# Patient Record
Sex: Female | Born: 1940 | Race: White | Hispanic: No | Smoking: Never smoker
Health system: Southern US, Community
[De-identification: ages and names within clinical notes are randomized; demographics above are authoritative.]

## PROBLEM LIST (undated history)

## (undated) ENCOUNTER — Emergency Department (HOSPITAL_COMMUNITY): Discharge status: Absent without leave | Payer: Medicare Other

## (undated) DIAGNOSIS — M1990 Unspecified osteoarthritis, unspecified site: Secondary | ICD-10-CM

## (undated) DIAGNOSIS — K5792 Diverticulitis of intestine, part unspecified, without perforation or abscess without bleeding: Secondary | ICD-10-CM

## (undated) DIAGNOSIS — I10 Essential (primary) hypertension: Secondary | ICD-10-CM

---

## 2004-02-08 ENCOUNTER — Encounter: Admission: RE | Admit: 2004-02-08 | Discharge: 2004-02-08 | Payer: Self-pay | Admitting: Orthopedic Surgery

## 2011-07-11 ENCOUNTER — Encounter: Payer: Self-pay | Admitting: *Deleted

## 2011-07-11 ENCOUNTER — Emergency Department (HOSPITAL_BASED_OUTPATIENT_CLINIC_OR_DEPARTMENT_OTHER)
Admission: EM | Admit: 2011-07-11 | Discharge: 2011-07-11 | Disposition: A | Discharge status: Home / Self Care | Payer: Medicare Other | Attending: Emergency Medicine | Admitting: Emergency Medicine

## 2011-07-11 DIAGNOSIS — I10 Essential (primary) hypertension: Secondary | ICD-10-CM | POA: Insufficient documentation

## 2011-07-11 DIAGNOSIS — L259 Unspecified contact dermatitis, unspecified cause: Secondary | ICD-10-CM | POA: Insufficient documentation

## 2011-07-11 DIAGNOSIS — Z79899 Other long term (current) drug therapy: Secondary | ICD-10-CM | POA: Insufficient documentation

## 2011-07-11 HISTORY — DX: Essential (primary) hypertension: I10

## 2011-07-11 MED ORDER — DIPHENHYDRAMINE HCL 2 % EX CREA: TOPICAL_CREAM | Freq: Three times a day (TID) | CUTANEOUS | Status: AC | PRN

## 2011-07-11 MED ORDER — PREDNISONE 20 MG PO TABS
60.0000 mg | ORAL_TABLET | Freq: Once | ORAL | Status: AC
  Administered 2011-07-11: 60 mg via ORAL
  Filled 2011-07-11: qty 3

## 2011-07-11 MED ORDER — PREDNISONE 20 MG PO TABS: ORAL_TABLET | ORAL | Status: DC

## 2011-07-11 NOTE — ED Provider Notes (Signed)
Scribed for Dayton Bailiff, MD, the patient was seen in room MH09/MH09 . This chart was scribed by Ellie Lunch. This patient's care was started at 10:51 PM.   CSN: [id number] Arrival date & time: 07/11/2011 10:38 PM   Chief Complaint  Patient presents with  . Rash   (Include location/radiation/quality/duration/timing/severity/associated sxs/prior treatment) HPI Angela Griffin is a 70 y.o. female who presents to the Emergency Department complaining of rash starting yesterday. Pt reports she was in her garden, cut a plant in her garden that smelled like eucalyptus, and developed a rash. Pt is concerned that she will be unable to board a plane in a few days.  She denies shortness of breath, oral lesions.  Has been applying hydrocortisone cream to the area for one day without relief.  States that the area has spread from her arm to her neck.  Denies cp, fever, chills, abd pain, n/v  Past Medical History  Diagnosis Date  . Hypertension     History reviewed. No pertinent past surgical history.  History reviewed. No pertinent family history.  History  Substance Use Topics  . Smoking status: Not on file  . Smokeless tobacco: Not on file  . Alcohol Use:     Review of Systems 10 Systems reviewed and are negative for acute change except as noted in the HPI.   Allergies  Review of patient's allergies indicates no known allergies.  Home Medications   Current Outpatient Rx  Name Route Sig Dispense Refill  . CALCIUM CARBONATE-VITAMIN D 600-400 MG-UNIT PO TABS Oral Take 1 tablet by mouth daily.      Marland Kitchen CLONAZEPAM 0.5 MG PO TABS Oral Take 0.5 mg by mouth at bedtime.      Marland Kitchen ONE-DAILY MULTI VITAMINS PO TABS Oral Take 1 tablet by mouth daily.      Marland Kitchen PRESCRIPTION MEDICATION Oral Take 0.5 tablets by mouth daily. Unknown blood pressure med     . TRAZODONE HCL 50 MG PO TABS Oral Take 50 mg by mouth at bedtime.      Marland Kitchen DIPHENHYDRAMINE HCL 2 % EX CREA Topical Apply topically 3 (three) times daily as  needed for itching. 30 g 0  . PREDNISONE 20 MG PO TABS  Please take 3 tablets by mouth for 3 days, 2 tablets by mouth for 3 days, 1 tablet by mouth for 3 days 18 tablet 0    Physical Exam    BP 140/100  Pulse 50  Temp(Src) 98.4 F (36.9 C) (Oral)  Resp 20  Ht 5\' 2"  (1.575 m)  Wt 120 lb (54.432 kg)  BMI 21.95 kg/m2  SpO2 96%  Physical Exam  Nursing note and vitals reviewed. Constitutional: She is oriented to person, place, and time. She appears well-developed and well-nourished.  HENT:  Head: Normocephalic and atraumatic.  Eyes: Conjunctivae and EOM are normal. Pupils are equal, round, and reactive to light.  Neck: Normal range of motion. Neck supple.  Cardiovascular: Normal rate, regular rhythm and normal heart sounds.   Pulmonary/Chest: Effort normal and breath sounds normal.  Abdominal: Soft. Bowel sounds are normal.  Musculoskeletal: Normal range of motion.  Neurological: She is alert and oriented to person, place, and time.  Skin: Skin is warm and dry. Rash (contact dermatitis to left antecubital fossa, back of neck right side. also with small area near the mouth on right.) noted.  Psychiatric: She has a normal mood and affect.    ED Course  Procedures  OTHER DATA REVIEWED: Nursing notes, vital signs, and  past medical records reviewed.   DIAGNOSTIC STUDIES: Oxygen Saturation is 96% on room air, normal by my interpretation.     ED COURSE / COORDINATION OF CARE: 22:52 Discussed rash with Pt. Likely poison ivy rash. Plan to treat with steroids. For itching, recommended PT use benadryl cream or OTC non-steroid anti-itch cream.  MDM: The patient's rash is consistent with a mild case of contact dermatitis. She is concerned that she will be unable to board the plane for her vacation in a few days due to the rash as one of her relatives had a similar instance.  Given her situation and early facial involvement, I will place her on a steroid taper and recommend her to apply  benadryl cream for symptom relief.  i explained that a steroid injection may result in return of the rash and that a tapering dose is the standard treatment.  She has no resp sx.  She will be dc home with instructions to follow up with her primary physician as needed.  Signs and sx for which to return to ed were provided.  SCRIBE ATTESTATION: I personally performed the services described in this documentation, which was scribed in my presence. The recorded information has been reviewed and considered.         Dayton Bailiff, MD 07/11/11 2328

## 2011-07-11 NOTE — ED Notes (Signed)
Pt states she was cutting some bushes and then developed a rash to her face, neck and arms. +itching. Has planned a trip and is afraid they won't allow her on the plane with the rash.

## 2012-02-11 ENCOUNTER — Ambulatory Visit: Payer: Medicare Other | Admitting: Rehabilitation

## 2012-02-16 ENCOUNTER — Ambulatory Visit: Payer: Medicare Other | Admitting: Physical Therapy

## 2012-02-16 DIAGNOSIS — R262 Difficulty in walking, not elsewhere classified: Secondary | ICD-10-CM | POA: Insufficient documentation

## 2012-02-16 DIAGNOSIS — M25673 Stiffness of unspecified ankle, not elsewhere classified: Secondary | ICD-10-CM | POA: Insufficient documentation

## 2012-02-16 DIAGNOSIS — M25579 Pain in unspecified ankle and joints of unspecified foot: Secondary | ICD-10-CM | POA: Insufficient documentation

## 2012-02-16 DIAGNOSIS — M25676 Stiffness of unspecified foot, not elsewhere classified: Secondary | ICD-10-CM | POA: Insufficient documentation

## 2012-02-16 DIAGNOSIS — IMO0001 Reserved for inherently not codable concepts without codable children: Secondary | ICD-10-CM | POA: Insufficient documentation

## 2012-02-18 ENCOUNTER — Ambulatory Visit: Payer: Medicare Other | Admitting: Physical Therapy

## 2012-02-23 ENCOUNTER — Ambulatory Visit: Payer: Medicare Other | Admitting: Physical Therapy

## 2012-02-25 ENCOUNTER — Ambulatory Visit: Payer: Medicare Other | Admitting: Physical Therapy

## 2012-02-25 DIAGNOSIS — M25676 Stiffness of unspecified foot, not elsewhere classified: Secondary | ICD-10-CM | POA: Insufficient documentation

## 2012-02-25 DIAGNOSIS — IMO0001 Reserved for inherently not codable concepts without codable children: Secondary | ICD-10-CM | POA: Insufficient documentation

## 2012-02-25 DIAGNOSIS — M25579 Pain in unspecified ankle and joints of unspecified foot: Secondary | ICD-10-CM | POA: Insufficient documentation

## 2012-02-25 DIAGNOSIS — R262 Difficulty in walking, not elsewhere classified: Secondary | ICD-10-CM | POA: Insufficient documentation

## 2012-02-25 DIAGNOSIS — M25673 Stiffness of unspecified ankle, not elsewhere classified: Secondary | ICD-10-CM | POA: Insufficient documentation

## 2012-03-01 ENCOUNTER — Ambulatory Visit: Payer: Medicare Other | Admitting: Physical Therapy

## 2012-03-03 ENCOUNTER — Ambulatory Visit: Payer: Medicare Other | Admitting: Physical Therapy

## 2012-03-07 ENCOUNTER — Ambulatory Visit: Payer: Medicare Other | Admitting: Physical Therapy

## 2012-03-08 ENCOUNTER — Ambulatory Visit: Payer: Medicare Other | Admitting: Physical Therapy

## 2012-12-01 ENCOUNTER — Emergency Department (HOSPITAL_BASED_OUTPATIENT_CLINIC_OR_DEPARTMENT_OTHER)
Admission: EM | Admit: 2012-12-01 | Discharge: 2012-12-02 | Disposition: A | Discharge status: Home / Self Care | Payer: Medicare Other | Attending: Emergency Medicine | Admitting: Emergency Medicine

## 2012-12-01 ENCOUNTER — Encounter (HOSPITAL_BASED_OUTPATIENT_CLINIC_OR_DEPARTMENT_OTHER): Payer: Self-pay | Admitting: Emergency Medicine

## 2012-12-01 ENCOUNTER — Emergency Department (HOSPITAL_BASED_OUTPATIENT_CLINIC_OR_DEPARTMENT_OTHER): Payer: Medicare Other

## 2012-12-01 DIAGNOSIS — IMO0002 Reserved for concepts with insufficient information to code with codable children: Secondary | ICD-10-CM | POA: Insufficient documentation

## 2012-12-01 DIAGNOSIS — K5900 Constipation, unspecified: Secondary | ICD-10-CM | POA: Insufficient documentation

## 2012-12-01 DIAGNOSIS — K5792 Diverticulitis of intestine, part unspecified, without perforation or abscess without bleeding: Secondary | ICD-10-CM

## 2012-12-01 DIAGNOSIS — Z8739 Personal history of other diseases of the musculoskeletal system and connective tissue: Secondary | ICD-10-CM | POA: Insufficient documentation

## 2012-12-01 DIAGNOSIS — R1084 Generalized abdominal pain: Secondary | ICD-10-CM | POA: Insufficient documentation

## 2012-12-01 DIAGNOSIS — K261 Acute duodenal ulcer with perforation: Secondary | ICD-10-CM | POA: Insufficient documentation

## 2012-12-01 DIAGNOSIS — I10 Essential (primary) hypertension: Secondary | ICD-10-CM | POA: Insufficient documentation

## 2012-12-01 DIAGNOSIS — Z79899 Other long term (current) drug therapy: Secondary | ICD-10-CM | POA: Insufficient documentation

## 2012-12-01 DIAGNOSIS — R110 Nausea: Secondary | ICD-10-CM | POA: Insufficient documentation

## 2012-12-01 HISTORY — DX: Unspecified osteoarthritis, unspecified site: M19.90

## 2012-12-01 HISTORY — DX: Diverticulitis of intestine, part unspecified, without perforation or abscess without bleeding: K57.92

## 2012-12-01 LAB — URINALYSIS, ROUTINE W REFLEX MICROSCOPIC
Bilirubin Urine: NEGATIVE
Ketones, ur: NEGATIVE mg/dL
Leukocytes, UA: NEGATIVE
Nitrite: NEGATIVE
Specific Gravity, Urine: 1.01 (ref 1.005–1.030)
pH: 7.5 (ref 5.0–8.0)

## 2012-12-01 LAB — CBC WITH DIFFERENTIAL/PLATELET
Eosinophils Absolute: 0.1 10*3/uL (ref 0.0–0.7)
Eosinophils Relative: 1 % (ref 0–5)
Hemoglobin: 12.4 g/dL (ref 12.0–15.0)
Lymphocytes Relative: 15 % (ref 12–46)
Lymphs Abs: 1.6 10*3/uL (ref 0.7–4.0)
Monocytes Absolute: 1.2 10*3/uL — ABNORMAL HIGH (ref 0.1–1.0)
Monocytes Relative: 12 % (ref 3–12)
RBC: 3.85 MIL/uL — ABNORMAL LOW (ref 3.87–5.11)
WBC: 10.5 10*3/uL (ref 4.0–10.5)

## 2012-12-01 LAB — LACTIC ACID, PLASMA: Lactic Acid, Venous: 0.6 mmol/L (ref 0.5–2.2)

## 2012-12-01 LAB — COMPREHENSIVE METABOLIC PANEL
AST: 22 U/L (ref 0–37)
Albumin: 4 g/dL (ref 3.5–5.2)
BUN: 11 mg/dL (ref 6–23)
CO2: 26 mEq/L (ref 19–32)
Calcium: 10.3 mg/dL (ref 8.4–10.5)
Creatinine, Ser: 0.9 mg/dL (ref 0.50–1.10)
GFR calc Af Amer: 72 mL/min — ABNORMAL LOW (ref >90)
GFR calc non Af Amer: 62 mL/min — ABNORMAL LOW (ref >90)
Glucose, Bld: 96 mg/dL (ref 70–99)
Total Bilirubin: 0.4 mg/dL (ref 0.3–1.2)

## 2012-12-01 LAB — URINE MICROSCOPIC-ADD ON

## 2012-12-01 LAB — OCCULT BLOOD X 1 CARD TO LAB, STOOL: Fecal Occult Bld: NEGATIVE

## 2012-12-01 LAB — LIPASE, BLOOD: Lipase: 90 U/L — ABNORMAL HIGH (ref 11–59)

## 2012-12-01 MED ORDER — SODIUM CHLORIDE 0.9 % IV SOLN
Freq: Once | INTRAVENOUS | Status: AC
  Administered 2012-12-01: 100 mL/h via INTRAVENOUS

## 2012-12-01 MED ORDER — IOHEXOL 300 MG/ML  SOLN
50.0000 mL | Freq: Once | INTRAMUSCULAR | Status: AC | PRN
  Administered 2012-12-01: 50 mL via ORAL

## 2012-12-01 MED ORDER — IOHEXOL 300 MG/ML  SOLN
100.0000 mL | Freq: Once | INTRAMUSCULAR | Status: AC | PRN
  Administered 2012-12-01: 100 mL via INTRAVENOUS

## 2012-12-01 NOTE — ED Notes (Signed)
Bilateral low abdominal pain since noon today. Pt reports hx of diverticulitis and constipation.  Has taken laxative with no results.

## 2012-12-02 MED ORDER — HYDROCODONE-ACETAMINOPHEN 5-325 MG PO TABS: 1.0000 | ORAL_TABLET | Freq: Four times a day (QID) | ORAL | Status: DC | PRN

## 2012-12-02 MED ORDER — MORPHINE SULFATE 4 MG/ML IJ SOLN
4.0000 mg | Freq: Once | INTRAMUSCULAR | Status: AC
  Administered 2012-12-02: 4 mg via INTRAVENOUS
  Filled 2012-12-02: qty 1

## 2012-12-02 MED ORDER — CIPROFLOXACIN HCL 500 MG PO TABS: 500.0000 mg | ORAL_TABLET | Freq: Two times a day (BID) | ORAL | Status: DC

## 2012-12-02 MED ORDER — ONDANSETRON HCL 4 MG/2ML IJ SOLN
4.0000 mg | Freq: Once | INTRAMUSCULAR | Status: AC
  Administered 2012-12-02: 4 mg via INTRAVENOUS
  Filled 2012-12-02: qty 2

## 2012-12-02 MED ORDER — METRONIDAZOLE IN NACL 5-0.79 MG/ML-% IV SOLN
500.0000 mg | Freq: Once | INTRAVENOUS | Status: AC
  Administered 2012-12-02: 500 mg via INTRAVENOUS
  Filled 2012-12-02: qty 100

## 2012-12-02 MED ORDER — CIPROFLOXACIN IN D5W 400 MG/200ML IV SOLN
400.0000 mg | Freq: Once | INTRAVENOUS | Status: AC
  Administered 2012-12-02: 400 mg via INTRAVENOUS
  Filled 2012-12-02: qty 200

## 2012-12-02 MED ORDER — METRONIDAZOLE 500 MG PO TABS: 500.0000 mg | ORAL_TABLET | Freq: Three times a day (TID) | ORAL | Status: DC

## 2012-12-02 NOTE — ED Provider Notes (Signed)
History     CSN: [id number]  Arrival date & time 12/01/12  2032   First MD Initiated Contact with Patient 12/01/12 2225      Chief Complaint  Patient presents with  . Abdominal Pain    (Consider location/radiation/quality/duration/timing/severity/associated sxs/prior treatment) HPI Comments: Patient presents with lower abdominal pain has been sharp and crampy for the past 12 hours. It is intermittent lasting for hours at a time associated with fullness in her abdomen. She admits that her bowel movements are regular last normal one was 2 days ago. She denies any nausea or vomiting. She took a laxative with no results. She denies any fevers, dysuria, hematuria, vaginal bleeding or discharge. She reports no previous abdominal surgeries. Denies any blood in her stools. Denies any chest pain or shortness of breath. Nothing makes the pain better or worse.  The history is provided by the patient.    Past Medical History  Diagnosis Date  . Hypertension   . Diverticulitis   . Arthritis     History reviewed. No pertinent past surgical history.  No family history on file.  History  Substance Use Topics  . Smoking status: Never Smoker   . Smokeless tobacco: Not on file  . Alcohol Use: Yes     Comment: occasional glass of wine    OB History    Grav Para Term Preterm Abortions TAB SAB Ect Mult Living                  Review of Systems  Constitutional: Negative for fever.  HENT: Negative for congestion and rhinorrhea.   Respiratory: Negative for cough, chest tightness and shortness of breath.   Cardiovascular: Negative for chest pain.  Gastrointestinal: Positive for nausea, abdominal pain and constipation. Negative for vomiting, diarrhea and blood in stool.  Genitourinary: Negative for dysuria, hematuria, vaginal bleeding and vaginal discharge.  Musculoskeletal: Negative for back pain.  Skin: Negative for rash.  Neurological: Negative for dizziness and weakness.  A complete 10  system review of systems was obtained and all systems are negative except as noted in the HPI and PMH.    Allergies  Review of patient's allergies indicates no known allergies.  Home Medications   Current Outpatient Rx  Name  Route  Sig  Dispense  Refill  . LISINOPRIL 20 MG PO TABS   Oral   Take 20 mg by mouth daily.         Marland Kitchen CALCIUM CARBONATE-VITAMIN D 600-400 MG-UNIT PO TABS   Oral   Take 1 tablet by mouth daily.           Marland Kitchen CLONAZEPAM 0.5 MG PO TABS   Oral   Take 0.5 mg by mouth at bedtime.           Marland Kitchen ONE-DAILY MULTI VITAMINS PO TABS   Oral   Take 1 tablet by mouth daily.           Marland Kitchen PREDNISONE 20 MG PO TABS      Please take 3 tablets by mouth for 3 days, 2 tablets by mouth for 3 days, 1 tablet by mouth for 3 days   18 tablet   0   . PRESCRIPTION MEDICATION   Oral   Take 0.5 tablets by mouth daily. Unknown blood pressure med          . TRAZODONE HCL 50 MG PO TABS   Oral   Take 50 mg by mouth at bedtime.  BP 192/89  Pulse 82  Temp 99.1 F (37.3 C) (Oral)  Resp 16  Ht 5\' 2"  (1.575 m)  Wt 122 lb (55.339 kg)  BMI 22.31 kg/m2  SpO2 100%  Physical Exam  Constitutional: She is oriented to person, place, and time. She appears well-developed and well-nourished. No distress.  HENT:  Head: Normocephalic and atraumatic.  Mouth/Throat: Oropharynx is clear and moist. No oropharyngeal exudate.  Eyes: Conjunctivae normal and EOM are normal.  Neck: Normal range of motion. Neck supple.  Cardiovascular: Normal rate, regular rhythm and normal heart sounds.   No murmur heard.      Equal femoral, DP and PT pulses bilaterally  Pulmonary/Chest: Effort normal and breath sounds normal. No respiratory distress.  Abdominal: Soft. There is tenderness. There is guarding. There is no rebound.       Diffuse abdominal tenderness with guarding.  Genitourinary:       FOBT negative. No fecal impaction.  Musculoskeletal: Normal range of motion. She exhibits  no edema and no tenderness.       No CVA tenderness  Neurological: She is alert and oriented to person, place, and time. No cranial nerve deficit. She exhibits normal muscle tone. Coordination normal.       5 Out of 5 strength in the bilateral lower extremities.  Skin: Skin is warm.    ED Course  Procedures (including critical care time)  Labs Reviewed  URINALYSIS, ROUTINE W REFLEX MICROSCOPIC - Abnormal; Notable for the following:    Hgb urine dipstick SMALL (*)     All other components within normal limits  CBC WITH DIFFERENTIAL - Abnormal; Notable for the following:    RBC 3.85 (*)     HCT 35.3 (*)     Monocytes Absolute 1.2 (*)     All other components within normal limits  COMPREHENSIVE METABOLIC PANEL - Abnormal; Notable for the following:    GFR calc non Af Amer 62 (*)     GFR calc Af Amer 72 (*)     All other components within normal limits  LIPASE, BLOOD - Abnormal; Notable for the following:    Lipase 90 (*)     All other components within normal limits  URINE MICROSCOPIC-ADD ON  OCCULT BLOOD X 1 CARD TO LAB, STOOL  LACTIC ACID, PLASMA  PROTIME-INR   Ct Abdomen Pelvis W Contrast  12/02/2012  *RADIOLOGY REPORT*  Clinical Data: Bilateral lower abdominal pain.  History diverticulitis.  Constipation.  CT ABDOMEN AND PELVIS WITH CONTRAST  Technique:  Multidetector CT imaging of the abdomen and pelvis was performed following the standard protocol during bolus administration of intravenous contrast.  Contrast: OMNIPAQUE IOHEXOL 300 MG/ML  SOLN, 50mL OMNIPAQUE IOHEXOL 300 MG/ML  SOLN  Comparison: Report from prior examination performed 04/17/2011 and 10/14/2009.  Films not available.  Findings: Abnormal appearance of the sigmoid colon with thickened wall with scattered diverticula and minimal haziness of surrounding fat planes.  This is suggestive of mild diverticulitis type changes without drainable abscess.  This area of colon will need to be assessed on follow-up after acute  episode has cleared to exclude underlying malignancy.  No surrounding adenopathy.  Hepatic cysts.  Low density structures within the kidneys may be cysts although some too small to adequately characterize.  No calcified gallstones.  Slightly prominent sized pancreatic duct.  No pancreatic mass or calcification noted.  No focal splenic or right adrenal lesion.  Minimal hyperplasia left adrenal gland.  Under distended stomach.  No gross abnormality.  Small pericardial effusion.  Heart slightly enlarged.  Atherosclerotic type changes of the aorta and branch vessels.  No abdominal aortic aneurysm.  Mild ectasia of the common iliac arteries bilaterally.  Degenerative changes most notable L5-S1.  Lung bases clear.  Uterus and ovaries without dominant mass.  IMPRESSION: Findings suggestive of sigmoid diverticulitis.  Follow up as discussed above appear   Original Report Authenticated By: Lacy Duverney, M.D.      No diagnosis found.    MDM  12 hours of crampy abdominal pain, constipation. No vomiting or fever. Abdomen diffusely tender.  UA negative, labs unremarkable though mild elevation of lipase at 90. Lactate normal.  CT scan shows diverticulitis of the sigmoid. Patient started on Cipro and Flagyl. She reports a negative colonoscopy about 7 years ago. NO vomiting in the ED. She has followup with her PCP Dr. Tresa Endo. Dr. Nicanor Alcon to disposition after antibiotics infused.      Glynn Octave, MD 12/02/12 [phone]

## 2014-04-17 DIAGNOSIS — K5790 Diverticulosis of intestine, part unspecified, without perforation or abscess without bleeding: Secondary | ICD-10-CM | POA: Insufficient documentation

## 2014-04-17 DIAGNOSIS — IMO0002 Reserved for concepts with insufficient information to code with codable children: Secondary | ICD-10-CM | POA: Insufficient documentation

## 2014-04-17 DIAGNOSIS — M1990 Unspecified osteoarthritis, unspecified site: Secondary | ICD-10-CM | POA: Insufficient documentation

## 2014-04-17 DIAGNOSIS — I10 Essential (primary) hypertension: Secondary | ICD-10-CM | POA: Insufficient documentation

## 2014-04-17 DIAGNOSIS — K649 Unspecified hemorrhoids: Secondary | ICD-10-CM | POA: Insufficient documentation

## 2014-04-17 DIAGNOSIS — F419 Anxiety disorder, unspecified: Secondary | ICD-10-CM | POA: Insufficient documentation

## 2014-04-17 DIAGNOSIS — M19079 Primary osteoarthritis, unspecified ankle and foot: Secondary | ICD-10-CM | POA: Insufficient documentation

## 2014-04-17 DIAGNOSIS — R7989 Other specified abnormal findings of blood chemistry: Secondary | ICD-10-CM | POA: Insufficient documentation

## 2014-04-17 DIAGNOSIS — G4700 Insomnia, unspecified: Secondary | ICD-10-CM | POA: Insufficient documentation

## 2014-04-17 DIAGNOSIS — D696 Thrombocytopenia, unspecified: Secondary | ICD-10-CM | POA: Insufficient documentation

## 2014-05-05 ENCOUNTER — Encounter (HOSPITAL_BASED_OUTPATIENT_CLINIC_OR_DEPARTMENT_OTHER): Payer: Self-pay | Admitting: Emergency Medicine

## 2014-05-05 ENCOUNTER — Emergency Department (HOSPITAL_BASED_OUTPATIENT_CLINIC_OR_DEPARTMENT_OTHER)
Admission: EM | Admit: 2014-05-05 | Discharge: 2014-05-05 | Disposition: A | Discharge status: Home / Self Care | Payer: Medicare Other | Attending: Emergency Medicine | Admitting: Emergency Medicine

## 2014-05-05 DIAGNOSIS — I10 Essential (primary) hypertension: Secondary | ICD-10-CM

## 2014-05-05 DIAGNOSIS — Z792 Long term (current) use of antibiotics: Secondary | ICD-10-CM | POA: Insufficient documentation

## 2014-05-05 DIAGNOSIS — Z8719 Personal history of other diseases of the digestive system: Secondary | ICD-10-CM | POA: Insufficient documentation

## 2014-05-05 DIAGNOSIS — Z79899 Other long term (current) drug therapy: Secondary | ICD-10-CM | POA: Insufficient documentation

## 2014-05-05 DIAGNOSIS — Z8739 Personal history of other diseases of the musculoskeletal system and connective tissue: Secondary | ICD-10-CM | POA: Insufficient documentation

## 2014-05-05 LAB — URINALYSIS, ROUTINE W REFLEX MICROSCOPIC
BILIRUBIN URINE: NEGATIVE
GLUCOSE, UA: NEGATIVE mg/dL
HGB URINE DIPSTICK: NEGATIVE
KETONES UR: NEGATIVE mg/dL
LEUKOCYTES UA: NEGATIVE
Nitrite: NEGATIVE
Protein, ur: NEGATIVE mg/dL
Specific Gravity, Urine: 1.016 (ref 1.005–1.030)
Urobilinogen, UA: 0.2 mg/dL (ref 0.0–1.0)
pH: 6 (ref 5.0–8.0)

## 2014-05-05 LAB — BASIC METABOLIC PANEL
ANION GAP: 9 (ref 5–15)
BUN: 15 mg/dL (ref 6–23)
CALCIUM: 10.3 mg/dL (ref 8.4–10.5)
CO2: 27 meq/L (ref 19–32)
Chloride: 104 mEq/L (ref 96–112)
Creatinine, Ser: 0.9 mg/dL (ref 0.50–1.10)
GFR, EST AFRICAN AMERICAN: 72 mL/min — AB (ref >90)
GFR, EST NON AFRICAN AMERICAN: 62 mL/min — AB (ref >90)
GLUCOSE: 98 mg/dL (ref 70–99)
Potassium: 4 mEq/L (ref 3.7–5.3)
Sodium: 140 mEq/L (ref 137–147)

## 2014-05-05 LAB — CBC WITH DIFFERENTIAL/PLATELET
Basophils Absolute: 0 10*3/uL (ref 0.0–0.1)
Basophils Relative: 0 % (ref 0–1)
EOS PCT: 1 % (ref 0–5)
Eosinophils Absolute: 0 10*3/uL (ref 0.0–0.7)
HEMATOCRIT: 35.5 % — AB (ref 36.0–46.0)
Hemoglobin: 12.2 g/dL (ref 12.0–15.0)
LYMPHS ABS: 1.5 10*3/uL (ref 0.7–4.0)
Lymphocytes Relative: 25 % (ref 12–46)
MCH: 32.4 pg (ref 26.0–34.0)
MCHC: 34.4 g/dL (ref 30.0–36.0)
MCV: 94.2 fL (ref 78.0–100.0)
MONO ABS: 0.6 10*3/uL (ref 0.1–1.0)
Monocytes Relative: 9 % (ref 3–12)
Neutro Abs: 4 10*3/uL (ref 1.7–7.7)
Neutrophils Relative %: 65 % (ref 43–77)
Platelets: 165 10*3/uL (ref 150–400)
RBC: 3.77 MIL/uL — AB (ref 3.87–5.11)
RDW: 12.2 % (ref 11.5–15.5)
WBC: 6.1 10*3/uL (ref 4.0–10.5)

## 2014-05-05 NOTE — Discharge Instructions (Signed)
Hypertension Hypertension, commonly called high blood pressure, is when the force of blood pumping through your arteries is too strong. Your arteries are the blood vessels that carry blood from your heart throughout your body. A blood pressure reading consists of a higher number over a lower number, such as 110/72. The higher number (systolic) is the pressure inside your arteries when your heart pumps. The lower number (diastolic) is the pressure inside your arteries when your heart relaxes. Ideally you want your blood pressure below 120/80. Hypertension forces your heart to work harder to pump blood. Your arteries may become narrow or stiff. Having hypertension puts you at risk for heart disease, stroke, and other problems.  RISK FACTORS Some risk factors for high blood pressure are controllable. Others are not.  Risk factors you cannot control include:   Race. You may be at higher risk if you are African American.  Age. Risk increases with age.  Gender. Men are at higher risk than women before age 45 years. After age 65, women are at higher risk than men. Risk factors you can control include:  Not getting enough exercise or physical activity.  Being overweight.  Getting too much fat, sugar, calories, or salt in your diet.  Drinking too much alcohol. SIGNS AND SYMPTOMS Hypertension does not usually cause signs or symptoms. Extremely high blood pressure (hypertensive crisis) may cause headache, anxiety, shortness of breath, and nosebleed. DIAGNOSIS  To check if you have hypertension, your health care provider will measure your blood pressure while you are seated, with your arm held at the level of your heart. It should be measured at least twice using the same arm. Certain conditions can cause a difference in blood pressure between your right and left arms. A blood pressure reading that is higher than normal on one occasion does not mean that you need treatment. If one blood pressure reading  is high, ask your health care provider about having it checked again. TREATMENT  Treating high blood pressure includes making lifestyle changes and possibly taking medication. Living a healthy lifestyle can help lower high blood pressure. You may need to change some of your habits. Lifestyle changes may include:  Following the DASH diet. This diet is high in fruits, vegetables, and whole grains. It is low in salt, red meat, and added sugars.  Getting at least 2 1/2 hours of brisk physical activity every week.  Losing weight if necessary.  Not smoking.  Limiting alcoholic beverages.  Learning ways to reduce stress. If lifestyle changes are not enough to get your blood pressure under control, your health care provider may prescribe medicine. You may need to take more than one. Work closely with your health care provider to understand the risks and benefits. HOME CARE INSTRUCTIONS  Have your blood pressure rechecked as directed by your health care provider.   Only take medicine as directed by your health care provider. Follow the directions carefully. Blood pressure medicines must be taken as prescribed. The medicine does not work as well when you skip doses. Skipping doses also puts you at risk for problems.   Do not smoke.   Monitor your blood pressure at home as directed by your health care provider. SEEK MEDICAL CARE IF:   You think you are having a reaction to medicines taken.  You have recurrent headaches or feel dizzy.  You have swelling in your ankles.  You have trouble with your vision. SEEK IMMEDIATE MEDICAL CARE IF:  You develop a severe headache or   confusion.  You have unusual weakness, numbness, or feel faint.  You have severe chest or abdominal pain.  You vomit repeatedly.  You have trouble breathing. MAKE SURE YOU:   Understand these instructions.  Will watch your condition.  Will get help right away if you are not doing well or get  worse. Document Released: 10/12/2005 Document Revised: 10/17/2013 Document Reviewed: 08/04/2013 [employer] Patient Information 2015 [employer]. This information is not intended to replace advice given to you by your health care provider. Make sure you discuss any questions you have with your health care provider.  

## 2014-05-05 NOTE — ED Provider Notes (Signed)
CSN: [id number]     Arrival date & time 05/05/14  1155 History   First MD Initiated Contact with Patient 05/05/14 1216     Chief Complaint  Patient presents with  . Hypertension     (Consider location/radiation/quality/duration/timing/severity/associated sxs/prior Treatment) HPI Comments: Patient presents with elevated blood pressure. She has a history of hypertension and is on lisinopril 20 mg once daily. She denies any recent adjustments to her medication. She noted yesterday her blood pressure was 180/100. She says it was improved today but she wanted to come in he get checked out. She denies any chest pain or shortness of breath. She denies any headache or dizziness. She denies any unilateral numbness or weakness. She denies any speech deficits or vision changes. She does have some achiness in her legs which he thinks are originating from her ankles and knees. She has arthritis in these areas and feels that it's worse over the last couple of weeks. She denies any joint swelling or calf tenderness.  Patient is a 73 y.o. female presenting with hypertension.  Hypertension Pertinent negatives include no chest pain, no abdominal pain, no headaches and no shortness of breath.    Past Medical History  Diagnosis Date  . Hypertension   . Diverticulitis   . Arthritis    History reviewed. No pertinent past surgical history. History reviewed. No pertinent family history. History  Substance Use Topics  . Smoking status: Never Smoker   . Smokeless tobacco: Not on file  . Alcohol Use: Yes     Comment: occasional glass of wine   OB History   Grav Para Term Preterm Abortions TAB SAB Ect Mult Living                 Review of Systems  Constitutional: Negative for fever, chills, diaphoresis and fatigue.  HENT: Negative for congestion, rhinorrhea and sneezing.   Eyes: Negative.   Respiratory: Negative for cough, chest tightness and shortness of breath.   Cardiovascular: Negative for chest  pain and leg swelling.  Gastrointestinal: Negative for nausea, vomiting, abdominal pain, diarrhea and blood in stool.  Genitourinary: Negative for frequency, hematuria, flank pain and difficulty urinating.  Musculoskeletal: Positive for arthralgias. Negative for back pain.  Skin: Negative for rash.  Neurological: Negative for dizziness, speech difficulty, weakness, numbness and headaches.      Allergies  Review of patient's allergies indicates no known allergies.  Home Medications   Prior to Admission medications   Medication Sig Start Date End Date Taking? Authorizing Provider  DULoxetine (CYMBALTA) 20 MG capsule Take 20 mg by mouth daily.   Yes Historical Provider, MD  Calcium Carbonate-Vitamin D (CALTRATE 600+D) 600-400 MG-UNIT per tablet Take 1 tablet by mouth daily.      Historical Provider, MD  ciprofloxacin (CIPRO) 500 MG tablet Take 1 tablet (500 mg total) by mouth 2 (two) times daily. 12/02/12   April K Palumbo-Rasch, MD  clonazePAM (KLONOPIN) 0.5 MG tablet Take 0.5 mg by mouth at bedtime.      Historical Provider, MD  lisinopril (PRINIVIL,ZESTRIL) 20 MG tablet Take 20 mg by mouth daily.    Historical Provider, MD  Multiple Vitamin (MULTIVITAMIN) tablet Take 1 tablet by mouth daily.      Historical Provider, MD  PRESCRIPTION MEDICATION Take 0.5 tablets by mouth daily. Unknown blood pressure med     Historical Provider, MD  traZODone (DESYREL) 50 MG tablet Take 50 mg by mouth at bedtime.      Historical Provider, MD  BP 176/88  Pulse 66  Temp(Src) 98.1 F (36.7 C) (Oral)  Resp 20  Ht 5\' 2"  (1.575 m)  Wt 122 lb (55.339 kg)  BMI 22.31 kg/m2  SpO2 99% Physical Exam  Constitutional: She is oriented to person, place, and time. She appears well-developed and well-nourished.  HENT:  Head: Normocephalic and atraumatic.  Eyes: Pupils are equal, round, and reactive to light.  Neck: Normal range of motion. Neck supple.  Cardiovascular: Normal rate, regular rhythm and normal heart  sounds.   Pulmonary/Chest: Effort normal and breath sounds normal. No respiratory distress. She has no wheezes. She has no rales. She exhibits no tenderness.  Abdominal: Soft. Bowel sounds are normal. There is no tenderness. There is no rebound and no guarding.  Musculoskeletal: Normal range of motion. She exhibits no edema.  No bony tenderness. No joint swelling or erythema. She has strong symmetric pedal pulses  Lymphadenopathy:    She has no cervical adenopathy.  Neurological: She is alert and oriented to person, place, and time. She has normal strength. No cranial nerve deficit or sensory deficit. GCS eye subscore is 4. GCS verbal subscore is 5. GCS motor subscore is 6.  Skin: Skin is warm and dry. No rash noted.  Psychiatric: She has a normal mood and affect.    ED Course  Procedures (including critical care time) Labs Review Labs Reviewed  BASIC METABOLIC PANEL - Abnormal; Notable for the following:    GFR calc non Af Amer 62 (*)    GFR calc Af Amer 72 (*)    All other components within normal limits  CBC WITH DIFFERENTIAL - Abnormal; Notable for the following:    RBC 3.77 (*)    HCT 35.5 (*)    All other components within normal limits  URINALYSIS, ROUTINE W REFLEX MICROSCOPIC    Imaging Review No results found.   EKG Interpretation   Date/Time:  Saturday May 05 2014 12:27:03 EDT Ventricular Rate:  61 PR Interval:  150 QRS Duration: 90 QT Interval:  398 QTC Calculation: 400 R Axis:   72 Text Interpretation:  Normal sinus rhythm Normal ECG No old tracing to  compare Confirmed by Zohair Epp  MD, Chasin Findling ([id number]) on 05/05/2014 2:23:15 PM      MDM   Final diagnoses:  HTN (hypertension), benign    Patient's labs are unremarkable. She has new symptoms of hypertensive urgency or emergency. Her blood pressure is improved. She has no strokelike symptoms. Her electrolytes are normal and she has no claudication symptoms. She was encouraged to followup with her primary care  physician for recheck on her blood pressure next week.    Rolan BuccoMelanie Hyun Marsalis, MD 05/05/14 [phone]

## 2014-05-05 NOTE — ED Notes (Signed)
Family at bedside. Pt given ice water to drink.

## 2014-05-05 NOTE — ED Notes (Signed)
Pt reports that her BP was 180/100 yesterday.  Reports that now it is back to normal.  Denies HA, blurred vision.  Denies missing medication yesterday.

## 2015-03-07 DIAGNOSIS — M545 Low back pain, unspecified: Secondary | ICD-10-CM | POA: Insufficient documentation

## 2015-03-15 DIAGNOSIS — Z2821 Immunization not carried out because of patient refusal: Secondary | ICD-10-CM | POA: Insufficient documentation

## 2015-03-21 ENCOUNTER — Emergency Department (HOSPITAL_BASED_OUTPATIENT_CLINIC_OR_DEPARTMENT_OTHER)
Admission: EM | Admit: 2015-03-21 | Discharge: 2015-03-21 | Discharge status: Against Medical Advice | Payer: Medicare Other | Attending: Emergency Medicine | Admitting: Emergency Medicine

## 2015-03-21 ENCOUNTER — Emergency Department (HOSPITAL_BASED_OUTPATIENT_CLINIC_OR_DEPARTMENT_OTHER): Payer: Medicare Other

## 2015-03-21 ENCOUNTER — Encounter (HOSPITAL_BASED_OUTPATIENT_CLINIC_OR_DEPARTMENT_OTHER): Payer: Self-pay

## 2015-03-21 DIAGNOSIS — M545 Low back pain: Secondary | ICD-10-CM | POA: Diagnosis not present

## 2015-03-21 DIAGNOSIS — M25551 Pain in right hip: Secondary | ICD-10-CM | POA: Diagnosis present

## 2015-03-21 DIAGNOSIS — Z79899 Other long term (current) drug therapy: Secondary | ICD-10-CM | POA: Insufficient documentation

## 2015-03-21 DIAGNOSIS — I10 Essential (primary) hypertension: Secondary | ICD-10-CM | POA: Insufficient documentation

## 2015-03-21 DIAGNOSIS — Z792 Long term (current) use of antibiotics: Secondary | ICD-10-CM | POA: Insufficient documentation

## 2015-03-21 DIAGNOSIS — Z8719 Personal history of other diseases of the digestive system: Secondary | ICD-10-CM | POA: Insufficient documentation

## 2015-03-21 NOTE — ED Provider Notes (Signed)
CSN: [id number]     Arrival date & time 03/21/15  1305 History   First MD Initiated Contact with Patient 03/21/15 1315     Chief Complaint  Patient presents with  . Hip Pain     (Consider location/radiation/quality/duration/timing/severity/associated sxs/prior Treatment) HPI Comments: Patient complains of pain in her right hip that has been ongoing for "years". Pain is become worse over the past several days. She denies any falls or trauma. She's been taking tramadol at home without relief. Patient reports a long history of arthritis and sees an orthopedic specialist Dr. Samuel BoucheLucas at [facility]. She states she had an MRI of her lumbar spine in his office last week. She does not know the results. Denied any focal weakness, numbness or tingling. No bowel or bladder incontinence. No fever or vomiting. No chest pain or shortness of breath. She states she is unable to walk but she is walking around the room without a problem and drove herself to the ED.  Patient is a 74 y.o. female presenting with hip pain. The history is provided by the patient.  Hip Pain Pertinent negatives include no chest pain, no abdominal pain, no headaches and no shortness of breath.    Past Medical History  Diagnosis Date  . Hypertension   . Diverticulitis   . Arthritis    History reviewed. No pertinent past surgical history. No family history on file. History  Substance Use Topics  . Smoking status: Never Smoker   . Smokeless tobacco: Not on file  . Alcohol Use: Yes     Comment: occasional glass of wine   OB History    No data available     Review of Systems  Constitutional: Negative for fever, activity change and appetite change.  Respiratory: Negative for cough, chest tightness and shortness of breath.   Cardiovascular: Negative for chest pain.  Gastrointestinal: Negative for nausea, vomiting and abdominal pain.  Genitourinary: Negative for dysuria, vaginal bleeding, vaginal discharge, menstrual problem and  pelvic pain.  Musculoskeletal: Positive for myalgias, back pain and arthralgias.  Skin: Negative for rash.  Neurological: Negative for dizziness, weakness, light-headedness, numbness and headaches.  A complete 10 system review of systems was obtained and all systems are negative except as noted in the HPI and PMH.      Allergies  Review of patient's allergies indicates no known allergies.  Home Medications   Prior to Admission medications   Medication Sig Start Date End Date Taking? Authorizing Provider  Calcium Carbonate-Vitamin D (CALTRATE 600+D) 600-400 MG-UNIT per tablet Take 1 tablet by mouth daily.      Historical Provider, MD  ciprofloxacin (CIPRO) 500 MG tablet Take 1 tablet (500 mg total) by mouth 2 (two) times daily. 12/02/12   April Palumbo, MD  clonazePAM (KLONOPIN) 0.5 MG tablet Take 0.5 mg by mouth at bedtime.      Historical Provider, MD  DULoxetine (CYMBALTA) 20 MG capsule Take 20 mg by mouth daily.    Historical Provider, MD  lisinopril (PRINIVIL,ZESTRIL) 20 MG tablet Take 20 mg by mouth daily.    Historical Provider, MD  Multiple Vitamin (MULTIVITAMIN) tablet Take 1 tablet by mouth daily.      Historical Provider, MD  PRESCRIPTION MEDICATION Take 0.5 tablets by mouth daily. Unknown blood pressure med     Historical Provider, MD  traZODone (DESYREL) 50 MG tablet Take 50 mg by mouth at bedtime.      Historical Provider, MD   BP 158/89 mmHg  Pulse 81  Temp(Src)  98.2 F (36.8 C) (Oral)  Resp 16  Ht  (1.575 m)  Wt 120 lb (54.432 kg)  BMI 21.94 kg/m2  SpO2 98% Physical Exam  Constitutional: She is oriented to person, place, and time. She appears well-developed and well-nourished. No distress.  HENT:  Head: Normocephalic and atraumatic.  Mouth/Throat: Oropharynx is clear and moist. No oropharyngeal exudate.  Eyes: Conjunctivae and EOM are normal. Pupils are equal, round, and reactive to light.  Neck: Normal range of motion. Neck supple.  No meningismus.   Cardiovascular: Normal rate, regular rhythm, normal heart sounds and intact distal pulses.   No murmur heard. Pulmonary/Chest: Effort normal and breath sounds normal. No respiratory distress.  Abdominal: Soft. There is no tenderness. There is no rebound and no guarding.  Musculoskeletal: Normal range of motion. She exhibits tenderness. She exhibits no edema.  Right paraspinal lumbar and SI joint tenderness  Full range of motion of right hip without pain.  5/5 strength in bilateral lower extremities. Ankle plantar and dorsiflexion intact. Great toe extension intact bilaterally. +2 DP and PT pulses. +2 patellar reflexes bilaterally. Normal gait.   Neurological: She is alert and oriented to person, place, and time. No cranial nerve deficit. She exhibits normal muscle tone. Coordination normal.  No ataxia on finger to nose bilaterally. No pronator drift. 5/5 strength throughout. CN 2-12 intact. Negative Romberg. Equal grip strength. Sensation intact. Gait is normal.   Skin: Skin is warm.  Psychiatric: She has a normal mood and affect. Her behavior is normal.  Nursing note and vitals reviewed.   ED Course  Procedures (including critical care time) Labs Review Labs Reviewed - No data to display  Imaging Review Dg Hip Unilat With Pelvis 2-3 Views Right  03/21/2015   CLINICAL DATA:  Chronic pain for approximately 1 year. No history of trauma  EXAM: RIGHT HIP (WITH PELVIS) 2-3 VIEWS  COMPARISON:  None.  FINDINGS: Frontal pelvis as well as frontal and lateral right hip images were obtained. There is no fracture or dislocation. Joint spaces appear intact. No erosive change.  IMPRESSION: No fracture or dislocation. Hip joints appear symmetric bilaterally.   Electronically Signed   By: Bretta Bang III M.D.   On: 03/21/2015 13:37     EKG Interpretation None      MDM   Final diagnoses:  Hip pain, acute, right   ongoing right hip pain for several years. No recent falls or injury. No  focal weakness, numbness or tingling. No bowel bladder incontinence. No fever or vomiting.  Recent MRI outpatient facility. Neurovascularly intact. Full range of motion of hip without pain. Low suspicion for cauda equina or cord compression.  [facility] was contacted. Patient has no record of an MRI or any results since 2012. Also contacted office of Dr. Magdalene River in [facility]. They have no record of an MRI or any patient was for back pain.  Patient states she cannot wait for the results and needs to leave. She is ambulatory without a problem. She eloped from the ED before I could speak with her again.  MRI results were received and showed small central disc protrusion at T12/L1. Mild right foraminal narrowing at L3/4. Bulging and spurring of the left at L5/4 with some reticular and foraminal stenosis on the left. Mild foraminal narrowing left L5/S1. Study date May 20th 2016.  Patient left ED without receiving discharge instructions.   Glynn Octave, MD 03/21/15 [phone]

## 2015-03-21 NOTE — ED Notes (Signed)
Pt to nse desk-asked if she is ready to go and can she get dressed-advised she could redress and EDP would be in asap

## 2015-03-21 NOTE — ED Notes (Signed)
Pt c/o pain in right hip. Reports pain for "years." Sts pain is worse now

## 2015-03-21 NOTE — ED Notes (Signed)
MD at bedside. 

## 2015-03-21 NOTE — ED Notes (Signed)
Pt to nse desk-states she can not wait and is leaving-notified pt is in with an emergent pt and I would inform him-advised pt of need to f/u with MD that ordered the study-states she called them 2 days ago and they did not have the results-advised to call them daily until she receives results

## 2015-03-21 NOTE — ED Notes (Signed)
MD at bedside-advised pt that he is still attempting to locate results from recent MRI for pt-was given the choice to follow up with her ortho that ordered study or wait-pt states she will wait

## 2015-03-21 NOTE — ED Notes (Signed)
Patient transported to X-ray-steady gait

## 2015-03-21 NOTE — ED Notes (Signed)
Helped pt with the telephone in the room. Pt pacing the halls.

## 2015-03-21 NOTE — ED Notes (Addendum)
Pt left ED with steady gait and NAD-left ED w/o written d/c instructions and official d/c

## 2015-04-02 DIAGNOSIS — M5136 Other intervertebral disc degeneration, lumbar region: Secondary | ICD-10-CM | POA: Insufficient documentation

## 2015-04-02 DIAGNOSIS — M51369 Other intervertebral disc degeneration, lumbar region without mention of lumbar back pain or lower extremity pain: Secondary | ICD-10-CM | POA: Insufficient documentation

## 2015-06-29 ENCOUNTER — Emergency Department (HOSPITAL_BASED_OUTPATIENT_CLINIC_OR_DEPARTMENT_OTHER): Payer: Medicare Other

## 2015-06-29 ENCOUNTER — Emergency Department (HOSPITAL_BASED_OUTPATIENT_CLINIC_OR_DEPARTMENT_OTHER)
Admission: EM | Admit: 2015-06-29 | Discharge: 2015-06-29 | Disposition: A | Discharge status: Home / Self Care | Payer: Medicare Other | Attending: Emergency Medicine | Admitting: Emergency Medicine

## 2015-06-29 ENCOUNTER — Encounter (HOSPITAL_BASED_OUTPATIENT_CLINIC_OR_DEPARTMENT_OTHER): Payer: Self-pay | Admitting: *Deleted

## 2015-06-29 DIAGNOSIS — M1990 Unspecified osteoarthritis, unspecified site: Secondary | ICD-10-CM | POA: Diagnosis not present

## 2015-06-29 DIAGNOSIS — I10 Essential (primary) hypertension: Secondary | ICD-10-CM | POA: Diagnosis not present

## 2015-06-29 DIAGNOSIS — S92411A Displaced fracture of proximal phalanx of right great toe, initial encounter for closed fracture: Secondary | ICD-10-CM | POA: Insufficient documentation

## 2015-06-29 DIAGNOSIS — S99921A Unspecified injury of right foot, initial encounter: Secondary | ICD-10-CM | POA: Diagnosis present

## 2015-06-29 DIAGNOSIS — Z792 Long term (current) use of antibiotics: Secondary | ICD-10-CM | POA: Diagnosis not present

## 2015-06-29 DIAGNOSIS — Y92410 Unspecified street and highway as the place of occurrence of the external cause: Secondary | ICD-10-CM | POA: Diagnosis not present

## 2015-06-29 DIAGNOSIS — Z8719 Personal history of other diseases of the digestive system: Secondary | ICD-10-CM | POA: Diagnosis not present

## 2015-06-29 DIAGNOSIS — Y9389 Activity, other specified: Secondary | ICD-10-CM | POA: Insufficient documentation

## 2015-06-29 DIAGNOSIS — S92911A Unspecified fracture of right toe(s), initial encounter for closed fracture: Secondary | ICD-10-CM

## 2015-06-29 DIAGNOSIS — Y998 Other external cause status: Secondary | ICD-10-CM | POA: Insufficient documentation

## 2015-06-29 DIAGNOSIS — Z79899 Other long term (current) drug therapy: Secondary | ICD-10-CM | POA: Insufficient documentation

## 2015-06-29 MED ORDER — HYDROCODONE-ACETAMINOPHEN 5-325 MG PO TABS: 2.0000 | ORAL_TABLET | ORAL | Status: DC | PRN

## 2015-06-29 MED ORDER — IBUPROFEN 800 MG PO TABS
800.0000 mg | ORAL_TABLET | Freq: Once | ORAL | Status: AC
  Administered 2015-06-29: 800 mg via ORAL
  Filled 2015-06-29: qty 1
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
## 2015-06-29 NOTE — Discharge Instructions (Signed)

## 2015-07-03 ENCOUNTER — Ambulatory Visit: Payer: Medicare Other | Admitting: Family Medicine

## 2015-07-08 ENCOUNTER — Ambulatory Visit (INDEPENDENT_AMBULATORY_CARE_PROVIDER_SITE_OTHER): Payer: Medicare Other | Admitting: Family Medicine

## 2015-07-08 ENCOUNTER — Ambulatory Visit (HOSPITAL_BASED_OUTPATIENT_CLINIC_OR_DEPARTMENT_OTHER)
Admission: RE | Admit: 2015-07-08 | Discharge: 2015-07-08 | Disposition: A | Discharge status: Home / Self Care | Payer: Medicare Other | Source: Ambulatory Visit | Attending: Family Medicine | Admitting: Family Medicine

## 2015-07-08 ENCOUNTER — Encounter: Payer: Self-pay | Admitting: Family Medicine

## 2015-07-08 VITALS — BP 161/92 | HR 81 | Ht 62.0 in | Wt 122.0 lb

## 2015-07-08 DIAGNOSIS — M79604 Pain in right leg: Secondary | ICD-10-CM | POA: Insufficient documentation

## 2015-07-08 DIAGNOSIS — S8991XA Unspecified injury of right lower leg, initial encounter: Secondary | ICD-10-CM | POA: Diagnosis not present

## 2015-07-08 NOTE — Patient Instructions (Addendum)
You have a contusion as well as a 1st toe fracture. Icing 15 minutes at a time 3-4 times a day (be careful this part of your leg though - don't put the ice over where the nerve is). Ibuprofen  three times a day with food OR aleve 2 tabs twice a day with food for pain and inflammation. Take norco/vicodin as needed for severe pain - do NOT take tylenol in addition to this. Postop shoe as you have been for another 2 1/2 weeks then can switch to a regular shoe. Activities, walking as tolerated. I'd expect the lower leg to improve over the next 2-4 weeks. Follow up with me in 4 weeks.

## 2015-07-10 DIAGNOSIS — M79604 Pain in right leg: Secondary | ICD-10-CM | POA: Insufficient documentation

## 2015-07-10 NOTE — Assessment & Plan Note (Signed)
great toe fracture, non displaced.  Should heal well with conservative treatment - postop shoe for comfort and transition to regular shoe when able.  Has secondary peroneal strain, contusion.  Icing, nsaids with norco as needed.  F/u in 4 weeks.  Consider physical therapy if not improving.
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
## 2015-07-15 ENCOUNTER — Encounter: Payer: Self-pay | Admitting: Family Medicine

## 2015-07-15 ENCOUNTER — Ambulatory Visit (INDEPENDENT_AMBULATORY_CARE_PROVIDER_SITE_OTHER): Payer: Medicare Other | Admitting: Family Medicine

## 2015-07-15 ENCOUNTER — Ambulatory Visit (HOSPITAL_BASED_OUTPATIENT_CLINIC_OR_DEPARTMENT_OTHER)
Admission: RE | Admit: 2015-07-15 | Discharge: 2015-07-15 | Disposition: A | Discharge status: Home / Self Care | Payer: Medicare Other | Source: Ambulatory Visit | Attending: Family Medicine | Admitting: Family Medicine

## 2015-07-15 VITALS — BP 171/92 | HR 83 | Ht 62.0 in | Wt 122.0 lb

## 2015-07-15 DIAGNOSIS — M79604 Pain in right leg: Secondary | ICD-10-CM

## 2015-07-15 DIAGNOSIS — M25561 Pain in right knee: Secondary | ICD-10-CM

## 2015-07-15 DIAGNOSIS — S8991XD Unspecified injury of right lower leg, subsequent encounter: Secondary | ICD-10-CM

## 2015-07-15 MED ORDER — HYDROCODONE-ACETAMINOPHEN 7.5-325 MG PO TABS: 1.0000 | ORAL_TABLET | Freq: Four times a day (QID) | ORAL | Status: DC | PRN

## 2015-07-15 NOTE — Patient Instructions (Signed)
Your x-ray suggests a possible crack in the bone on the outside of your leg. This will heal well over next 4 weeks though. You don't need to have this casted or a boot. Can transition out of your postop shoe as tolerated to a regular shoe now. Follow up with me in 4 weeks. Take norco only as needed - we do not refill this medicine.
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
## 2015-07-17 NOTE — Assessment & Plan Note (Signed)
great toe fracture healing well clinically.  Doppler u/s and radiographs of knee done today.  No evidence of DVT.  There's a question of a proximal fibular fracture though this may be degenerative.  Will treat as fracture - should heal well with conservative treatment over 6 weeks.  No evidence peroneal nerve injury.  Transition to regular shoe.  Norco if needed.  F/u in 4 weeks.

## 2015-07-22 ENCOUNTER — Telehealth: Payer: Self-pay | Admitting: Family Medicine

## 2015-07-22 NOTE — Telephone Encounter (Signed)
We can recheck her in the office to make sure nothing else is going on though she's had extensive workup to date.

## 2015-07-23 ENCOUNTER — Encounter: Payer: Self-pay | Admitting: Family Medicine

## 2015-07-23 ENCOUNTER — Ambulatory Visit (INDEPENDENT_AMBULATORY_CARE_PROVIDER_SITE_OTHER): Payer: Medicare Other | Admitting: Family Medicine

## 2015-07-23 VITALS — BP 166/95 | HR 86 | Ht 62.0 in | Wt 120.0 lb

## 2015-07-23 DIAGNOSIS — S8991XD Unspecified injury of right lower leg, subsequent encounter: Secondary | ICD-10-CM

## 2015-07-23 NOTE — Patient Instructions (Signed)
We will go ahead with an MRI of your knee to include your lower leg as well. Follow up with me the day after the MRI to go over results (we will not charge for this visit).
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
## 2015-07-25 NOTE — Assessment & Plan Note (Signed)
great toe fracture healing well clinically.  Leg pain lightly worse.  Doppler u/s and radiographs of knee negative though there's a question of a proximal fibular fracture - this may be degenerative.  No evidence peroneal nerve injury.  Concerning she continues to have 8/10 level of pain.  Advised we go ahead with an MRI as next step.  Norco as needed.

## 2015-07-25 NOTE — Telephone Encounter (Signed)
Patient seen on 07-23-15.

## 2015-07-27 ENCOUNTER — Ambulatory Visit (HOSPITAL_BASED_OUTPATIENT_CLINIC_OR_DEPARTMENT_OTHER)
Admission: RE | Admit: 2015-07-27 | Discharge: 2015-07-27 | Disposition: A | Discharge status: Home / Self Care | Payer: Medicare Other | Source: Ambulatory Visit | Attending: Family Medicine | Admitting: Family Medicine

## 2015-07-27 DIAGNOSIS — S89201A Unspecified physeal fracture of upper end of right fibula, initial encounter for closed fracture: Secondary | ICD-10-CM | POA: Diagnosis not present

## 2015-07-27 DIAGNOSIS — Y9355 Activity, bike riding: Secondary | ICD-10-CM | POA: Diagnosis not present

## 2015-07-27 DIAGNOSIS — S8991XD Unspecified injury of right lower leg, subsequent encounter: Secondary | ICD-10-CM

## 2015-07-27 DIAGNOSIS — M79604 Pain in right leg: Secondary | ICD-10-CM | POA: Diagnosis present

## 2015-07-29 NOTE — Addendum Note (Signed)
Addended by: Lenda Kelp on: 07/29/2015 01:23 PM   Modules accepted: Kipp Brood

## 2015-08-05 ENCOUNTER — Ambulatory Visit: Payer: Medicare Other | Admitting: Family Medicine

## 2015-08-19 ENCOUNTER — Encounter: Payer: Self-pay | Admitting: Family Medicine

## 2015-08-19 ENCOUNTER — Ambulatory Visit (INDEPENDENT_AMBULATORY_CARE_PROVIDER_SITE_OTHER): Payer: Medicare Other | Admitting: Family Medicine

## 2015-08-19 ENCOUNTER — Encounter: Payer: Medicare Other | Admitting: Podiatry

## 2015-08-19 ENCOUNTER — Ambulatory Visit: Payer: Medicare Other | Admitting: Family Medicine

## 2015-08-19 VITALS — BP 164/96 | HR 96 | Ht 62.0 in | Wt 120.0 lb

## 2015-08-19 DIAGNOSIS — S8991XD Unspecified injury of right lower leg, subsequent encounter: Secondary | ICD-10-CM | POA: Diagnosis not present

## 2015-08-19 NOTE — Patient Instructions (Signed)
The thing I put on your toe is called 'Coban' - use this to keep the joint straight. Your knee is completely healed at this point - there are no restrictions on your activities at this point. Follow up with me as needed.

## 2015-08-20 NOTE — Assessment & Plan Note (Signed)
proximal fibula fracture clinically healed at this point as well.  Cleared for all activities without restrictions.  F/u prn
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
## 2015-08-27 ENCOUNTER — Ambulatory Visit (INDEPENDENT_AMBULATORY_CARE_PROVIDER_SITE_OTHER): Payer: Medicare Other | Admitting: Family Medicine

## 2015-08-27 ENCOUNTER — Encounter: Payer: Self-pay | Admitting: Family Medicine

## 2015-08-27 VITALS — Ht 62.0 in | Wt 122.0 lb

## 2015-08-27 DIAGNOSIS — M79604 Pain in right leg: Secondary | ICD-10-CM

## 2015-08-27 MED ORDER — DICLOFENAC SODIUM 75 MG PO TBEC: 75.0000 mg | DELAYED_RELEASE_TABLET | Freq: Two times a day (BID) | ORAL | Status: DC

## 2015-08-27 MED ORDER — NORTRIPTYLINE HCL 25 MG PO CAPS: 25.0000 mg | ORAL_CAPSULE | Freq: Every day | ORAL | Status: DC

## 2015-08-27 NOTE — Patient Instructions (Signed)
You're allergic to the prednisone otherwise this would be your best option for pain relief. Take nortriptyline once a day to block the nerve impulses. Take voltaren twice a day with food in addition to this. Follow up with me on Thursday for reevaluation.

## 2015-08-28 NOTE — Assessment & Plan Note (Signed)
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  No history of shingles and no rash.  Unfortunately she is allergic to prednisone so cannot take this.  We will try voltaren and nortriptyline instead, reevaluate her in 2 days.  Aside from brisk reflexes no other neurologic findings.
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
## 2015-08-29 ENCOUNTER — Ambulatory Visit (HOSPITAL_BASED_OUTPATIENT_CLINIC_OR_DEPARTMENT_OTHER)
Admission: RE | Admit: 2015-08-29 | Discharge: 2015-08-29 | Disposition: A | Discharge status: Home / Self Care | Payer: Medicare Other | Source: Ambulatory Visit | Attending: Family Medicine | Admitting: Family Medicine

## 2015-08-29 ENCOUNTER — Encounter: Payer: Self-pay | Admitting: Family Medicine

## 2015-08-29 ENCOUNTER — Ambulatory Visit (INDEPENDENT_AMBULATORY_CARE_PROVIDER_SITE_OTHER): Payer: Medicare Other | Admitting: Family Medicine

## 2015-08-29 VITALS — BP 162/90 | HR 83 | Ht 62.0 in

## 2015-08-29 DIAGNOSIS — W1839XD Other fall on same level, subsequent encounter: Secondary | ICD-10-CM | POA: Insufficient documentation

## 2015-08-29 DIAGNOSIS — M79674 Pain in right toe(s): Secondary | ICD-10-CM | POA: Diagnosis not present

## 2015-08-29 DIAGNOSIS — S92511D Displaced fracture of proximal phalanx of right lesser toe(s), subsequent encounter for fracture with routine healing: Secondary | ICD-10-CM | POA: Diagnosis not present

## 2015-08-29 DIAGNOSIS — M79671 Pain in right foot: Secondary | ICD-10-CM

## 2015-08-29 DIAGNOSIS — M79604 Pain in right leg: Secondary | ICD-10-CM

## 2015-08-29 NOTE — Patient Instructions (Signed)
Your toe pain is due to arthritis at the base of this toe. I'd recommend trying the hard soled shoe you have until your pain drops down to a 1-2 out of 10 level. Taping to the second toe is another thing that may help with the pain. Icing 15 minutes at a time 3-4 times a day. Continue the nortriptyline and voltaren for 5 more days - take your last doses on Tuesday and set them aside. Let me know how you're doing in 1-2 weeks (phone call).

## 2015-09-02 DIAGNOSIS — M79674 Pain in right toe(s): Secondary | ICD-10-CM | POA: Insufficient documentation

## 2015-09-02 NOTE — Assessment & Plan Note (Signed)
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  Improving however with nortriptyline and voltaren - advised to take for a full week before stopping.

## 2015-09-02 NOTE — Assessment & Plan Note (Signed)
Right 1st MTP pain - consistent with DJD.  Discussed hard soled shoe (has one frmo prior fracture), taping, icing.
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
## 2015-09-09 ENCOUNTER — Telehealth: Payer: Self-pay | Admitting: Family Medicine

## 2015-09-09 NOTE — Telephone Encounter (Signed)
Noted. Thanks.

## 2015-10-07 ENCOUNTER — Ambulatory Visit (HOSPITAL_BASED_OUTPATIENT_CLINIC_OR_DEPARTMENT_OTHER)
Admission: RE | Admit: 2015-10-07 | Discharge: 2015-10-07 | Disposition: A | Discharge status: Home / Self Care | Payer: Medicare Other | Source: Ambulatory Visit | Attending: Family Medicine | Admitting: Family Medicine

## 2015-10-07 ENCOUNTER — Ambulatory Visit (INDEPENDENT_AMBULATORY_CARE_PROVIDER_SITE_OTHER): Payer: Medicare Other | Admitting: Family Medicine

## 2015-10-07 ENCOUNTER — Telehealth: Payer: Self-pay | Admitting: Family Medicine

## 2015-10-07 ENCOUNTER — Encounter: Payer: Self-pay | Admitting: Family Medicine

## 2015-10-07 VITALS — BP 148/89 | HR 67 | Ht 62.0 in | Wt 118.0 lb

## 2015-10-07 DIAGNOSIS — M25551 Pain in right hip: Secondary | ICD-10-CM

## 2015-10-07 DIAGNOSIS — R292 Abnormal reflex: Secondary | ICD-10-CM | POA: Diagnosis not present

## 2015-10-07 NOTE — Patient Instructions (Signed)
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 Take voltaren 75mg  twice a day with food until I see you back. Nortriptyline at bedtime once a day as well. Tennis ball to massage area when sitting Pick 2-3 stretches where you feel the pull in the area of pain - do 3 of these and hold for 20-30 seconds at least once a day Hip side raises and standing hip rotations 3 sets of 10 once a day. Add ankle weight if these become too easy. Follow up with me in 2 weeks. We will refer you to neurology for hyperreflexia and clonus (the reflex issue in your legs) to make sure there is no further workup required or reason for this.

## 2015-10-07 NOTE — Telephone Encounter (Signed)
Patient came in to office. Answered questions.

## 2015-10-09 DIAGNOSIS — M25551 Pain in right hip: Secondary | ICD-10-CM | POA: Insufficient documentation

## 2015-10-09 DIAGNOSIS — R292 Abnormal reflex: Secondary | ICD-10-CM | POA: Insufficient documentation
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
## 2015-10-09 NOTE — Assessment & Plan Note (Signed)
consistent with piriformis syndrome.  Independently reviewed radiographs and on one view she has a possible inferior pubic ramus fracture - this would be unusual without an injury but will go ahead with CT to assess for this.  In meantime shown home exercises and stretches to do regularly.  Use the voltaren and nortriptyline she used for prior leg issue.

## 2015-10-09 NOTE — Assessment & Plan Note (Signed)
with clonus.  Will refer to neurology for further evaluation.

## 2015-10-10 ENCOUNTER — Ambulatory Visit (HOSPITAL_BASED_OUTPATIENT_CLINIC_OR_DEPARTMENT_OTHER)
Admission: RE | Admit: 2015-10-10 | Discharge: 2015-10-10 | Disposition: A | Discharge status: Home / Self Care | Payer: Medicare Other | Source: Ambulatory Visit | Attending: Family Medicine | Admitting: Family Medicine

## 2015-10-10 ENCOUNTER — Telehealth: Payer: Self-pay | Admitting: Family Medicine

## 2015-10-10 DIAGNOSIS — M25551 Pain in right hip: Secondary | ICD-10-CM | POA: Diagnosis present

## 2015-10-10 MED ORDER — DICLOFENAC SODIUM 75 MG PO TBEC: 75.0000 mg | DELAYED_RELEASE_TABLET | Freq: Two times a day (BID) | ORAL | Status: DC

## 2015-10-10 MED ORDER — NORTRIPTYLINE HCL 25 MG PO CAPS
25.0000 mg | ORAL_CAPSULE | Freq: Every day | ORAL | Status: DC
Start: 2015-10-10 — End: 2020-11-14

## 2015-10-10 NOTE — Addendum Note (Signed)
Addended by: Kathi SimpersWISE, Blaiden Werth F on: 10/10/2015 12:14 PM   Modules accepted: Orders

## 2015-10-10 NOTE — Addendum Note (Signed)
Addended by: Lenda KelpHUDNALL, Merari Pion R on: 10/10/2015 02:20 PM   Modules accepted: Orders

## 2015-10-10 NOTE — Telephone Encounter (Signed)
Spoke with patient in the office.

## 2015-10-11 NOTE — Addendum Note (Signed)
Addended by: Kathi SimpersWISE, Solace Wendorff F on: 10/11/2015 10:22 AM   Modules accepted: Orders

## 2015-10-16 ENCOUNTER — Ambulatory Visit: Payer: Medicare Other | Admitting: Family Medicine

## 2015-10-23 ENCOUNTER — Ambulatory Visit (INDEPENDENT_AMBULATORY_CARE_PROVIDER_SITE_OTHER): Payer: Medicare Other | Admitting: Family Medicine

## 2015-10-23 ENCOUNTER — Encounter: Payer: Self-pay | Admitting: Family Medicine

## 2015-10-23 VITALS — BP 175/102 | HR 69 | Wt 120.0 lb

## 2015-10-23 DIAGNOSIS — M79604 Pain in right leg: Secondary | ICD-10-CM | POA: Diagnosis not present

## 2015-10-23 DIAGNOSIS — R292 Abnormal reflex: Secondary | ICD-10-CM

## 2015-10-23 NOTE — Patient Instructions (Addendum)
We will check on the neurology referral. Take nortriptyline at bedtime once a day. Take they hydrocodone just as needed though we do not refill this medicine. Start physical therapy - do home exercises on days you don't go to therapy. Follow up with me in 1 month. We can always increase the nortriptyline if it's helping but not enough.

## 2015-10-24 ENCOUNTER — Ambulatory Visit: Payer: Medicare Other | Attending: Family Medicine | Admitting: Physical Therapy

## 2015-10-24 DIAGNOSIS — R262 Difficulty in walking, not elsewhere classified: Secondary | ICD-10-CM | POA: Diagnosis present

## 2015-10-24 DIAGNOSIS — R29898 Other symptoms and signs involving the musculoskeletal system: Secondary | ICD-10-CM | POA: Diagnosis present

## 2015-10-24 DIAGNOSIS — M5431 Sciatica, right side: Secondary | ICD-10-CM | POA: Diagnosis present
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
## 2015-10-25 ENCOUNTER — Ambulatory Visit: Payer: Medicare Other | Admitting: Physical Therapy

## 2015-10-25 NOTE — Assessment & Plan Note (Signed)
consistent with piriformis syndrome with sciatica.  Reassured regarding nortriptyline with cymbalta (level C).  Can take her hydrocodone as needed.  Start physical therapy.  Will again check on neurology referral as pain does have neuropathic component along with her hyperreflexia, clonus.  Neurologic issue may be the cause of her pain.
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
## 2015-10-25 NOTE — Assessment & Plan Note (Signed)
with clonus.  Will refer to neurology for further evaluation - has not heard anything yet per patient.

## 2015-11-05 ENCOUNTER — Ambulatory Visit: Payer: Medicare Other | Admitting: Physical Therapy

## 2015-11-07 ENCOUNTER — Encounter: Payer: Medicare Other | Admitting: Physical Therapy

## 2015-11-14 ENCOUNTER — Encounter: Payer: Medicare Other | Admitting: Physical Therapy

## 2015-11-19 ENCOUNTER — Encounter: Payer: Medicare Other | Admitting: Physical Therapy

## 2015-11-21 ENCOUNTER — Ambulatory Visit: Payer: Medicare Other | Admitting: Family Medicine

## 2015-11-21 ENCOUNTER — Encounter: Payer: Medicare Other | Admitting: Physical Therapy

## 2015-11-26 ENCOUNTER — Encounter: Payer: Medicare Other | Admitting: Physical Therapy

## 2015-12-26 ENCOUNTER — Telehealth: Payer: Self-pay | Admitting: Family Medicine

## 2015-12-26 ENCOUNTER — Ambulatory Visit: Payer: Medicare Other | Admitting: Family Medicine

## 2015-12-26 NOTE — Telephone Encounter (Signed)
d 

## 2016-01-24 NOTE — Progress Notes (Signed)
This encounter was created in error - please disregard.

## 2016-01-29 ENCOUNTER — Telehealth: Payer: Self-pay | Admitting: Family Medicine
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
## 2016-01-29 NOTE — Telephone Encounter (Signed)
I could pull up the note on Care Everywhere and her insurance is the one that's blocked her from getting an MRI.  They require her to see Dr. Tennis ShipFeraro in 6 weeks for follow up before they will approve a lumbar spine MRI.  She could see Dr. Maple HudsonMoser but would encounter the same problem.  This is why I wanted her to see neurology 4 months ago but she did not follow through with the appointments.

## 2016-01-30 NOTE — Telephone Encounter (Signed)
Spoke to patient and advised her to call Neurology office to see why MRI was canceled. Told patient that most insurance companies require 6 weeks of conservative treatment before approving MRI.

## 2016-02-04 ENCOUNTER — Ambulatory Visit (HOSPITAL_BASED_OUTPATIENT_CLINIC_OR_DEPARTMENT_OTHER)
Admission: RE | Admit: 2016-02-04 | Discharge: 2016-02-04 | Disposition: A | Discharge status: Home / Self Care | Payer: Medicare Other | Source: Ambulatory Visit | Attending: Family Medicine | Admitting: Family Medicine

## 2016-02-04 ENCOUNTER — Telehealth: Payer: Self-pay | Admitting: Family Medicine

## 2016-02-04 ENCOUNTER — Ambulatory Visit (INDEPENDENT_AMBULATORY_CARE_PROVIDER_SITE_OTHER): Payer: Medicare Other | Admitting: Family Medicine

## 2016-02-04 ENCOUNTER — Encounter: Payer: Self-pay | Admitting: Family Medicine

## 2016-02-04 VITALS — BP 177/94 | Ht 62.0 in | Wt 121.0 lb

## 2016-02-04 DIAGNOSIS — M25562 Pain in left knee: Secondary | ICD-10-CM | POA: Diagnosis present

## 2016-02-04 DIAGNOSIS — M5440 Lumbago with sciatica, unspecified side: Secondary | ICD-10-CM

## 2016-02-04 DIAGNOSIS — M545 Low back pain: Secondary | ICD-10-CM

## 2016-02-04 DIAGNOSIS — M79604 Pain in right leg: Secondary | ICD-10-CM

## 2016-02-04 NOTE — Telephone Encounter (Signed)
Cancel.

## 2016-02-05 DIAGNOSIS — M79605 Pain in left leg: Secondary | ICD-10-CM | POA: Insufficient documentation

## 2016-02-05 DIAGNOSIS — M545 Low back pain: Secondary | ICD-10-CM | POA: Insufficient documentation

## 2016-02-05 NOTE — Assessment & Plan Note (Signed)
with hyperreflexia.  Not improved with home exercises, physical therapy, nortriptyline, cymbalta, norco.  Neurology consultation - they ordered MRI.  As we have seen her for more than 6 weeks and put her through conservative measures we will order this and get it approved to assess for disc herniation.
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
## 2016-02-11 ENCOUNTER — Telehealth: Payer: Self-pay | Admitting: Family Medicine

## 2016-02-11 NOTE — Telephone Encounter (Signed)
Spoke to patient and told her that after she has her MRI, we would contact her the next business day with the results.

## 2016-02-15 ENCOUNTER — Ambulatory Visit (HOSPITAL_BASED_OUTPATIENT_CLINIC_OR_DEPARTMENT_OTHER)
Admission: RE | Admit: 2016-02-15 | Discharge: 2016-02-15 | Disposition: A | Discharge status: Home / Self Care | Payer: Medicare Other | Source: Ambulatory Visit | Attending: Family Medicine | Admitting: Family Medicine

## 2016-02-15 DIAGNOSIS — M5440 Lumbago with sciatica, unspecified side: Secondary | ICD-10-CM

## 2016-02-15 DIAGNOSIS — M5125 Other intervertebral disc displacement, thoracolumbar region: Secondary | ICD-10-CM | POA: Diagnosis not present

## 2016-02-15 DIAGNOSIS — M4806 Spinal stenosis, lumbar region: Secondary | ICD-10-CM | POA: Insufficient documentation

## 2016-02-17 ENCOUNTER — Ambulatory Visit (INDEPENDENT_AMBULATORY_CARE_PROVIDER_SITE_OTHER): Payer: Medicare Other | Admitting: Family Medicine

## 2016-02-17 ENCOUNTER — Telehealth: Payer: Self-pay | Admitting: Family Medicine

## 2016-02-17 DIAGNOSIS — M5440 Lumbago with sciatica, unspecified side: Secondary | ICD-10-CM

## 2016-02-17 NOTE — Patient Instructions (Signed)
Your MRI looks very good - I wouldn't recommend injections or seeing a neurosurgeon for this. I would recommend one of the following: seeing a neurologist [facility]([place] [facility]) to consider other neurologic disorders associated with hyperreflexia, bilateral leg pain; physical therapy - show us the paperwork you're talking about. We already tried the medications for nerve blockade.

## 2016-02-17 NOTE — Telephone Encounter (Signed)
Ok that's fine.  I appreciate her calling us first.  I will see her then.

## 2016-02-18 NOTE — Progress Notes (Signed)
Patient came in today to go over MRI results - nothing on MRI to account for her bilateral leg pain, weakness, hyperreflexia.  No changes compared to MRI from a year ago.  Has a few minimal disc bulges but no evidence of nerve impingement.  We discussed options - neurology referral, physical therapy.  She will consider these and let us know.

## 2016-02-21 ENCOUNTER — Telehealth: Payer: Self-pay | Admitting: Family Medicine

## 2016-02-21 NOTE — Telephone Encounter (Signed)
Spoke to patient and she would like to see a neurologist either in [place] or [place]. Told patient that I would put in referral.
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
## 2017-03-07 IMAGING — MR MR LUMBAR SPINE W/O CM
4 of 5 series · 26 of 48 positions shown · non-contrast
Comparison: MRI lumbar spine 03/15/2015.

CLINICAL DATA: Mid lung low back pain for 1 year radiating into the
right leg. No known injury. Subsequent encounter.

EXAM:
MRI LUMBAR SPINE WITHOUT CONTRAST
TECHNIQUE: Multiplanar, multisequence MR imaging of the lumbar spine was
performed. No intravenous contrast was administered.

[Series 2: T1 · sagittal · 4.0mm · 0.51mm/px · 7 of 16 slices shown (1 of 2)]
[im 1/16]
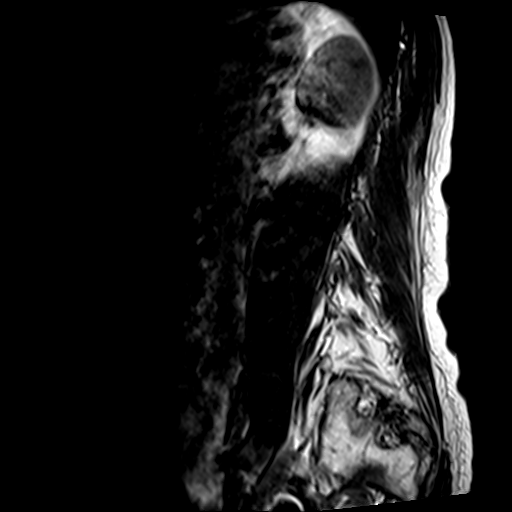
[im 3/16]
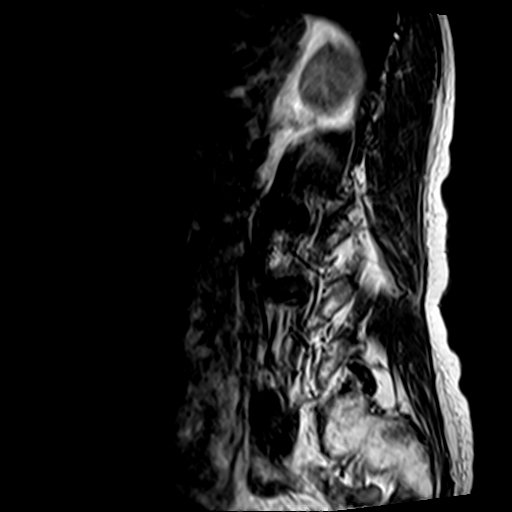
[im 6/16]
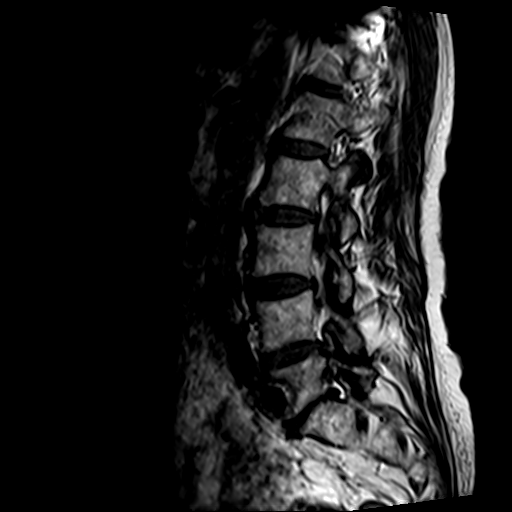
[im 8/16]
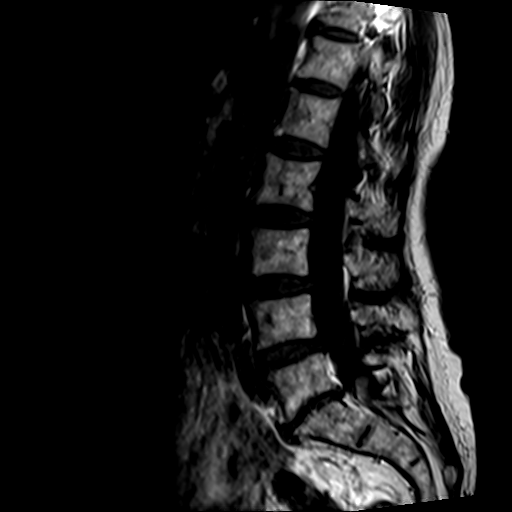
[im 11/16]
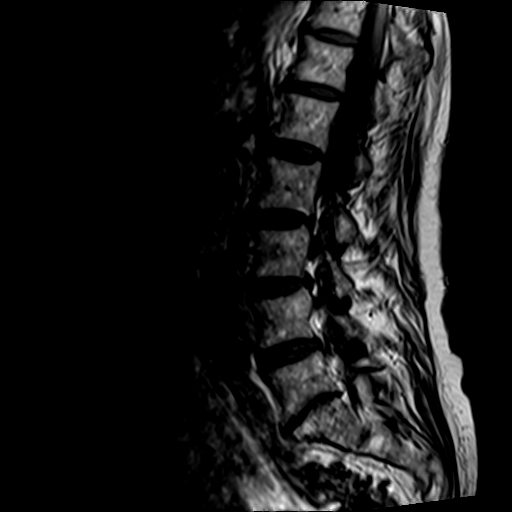
[im 13/16]
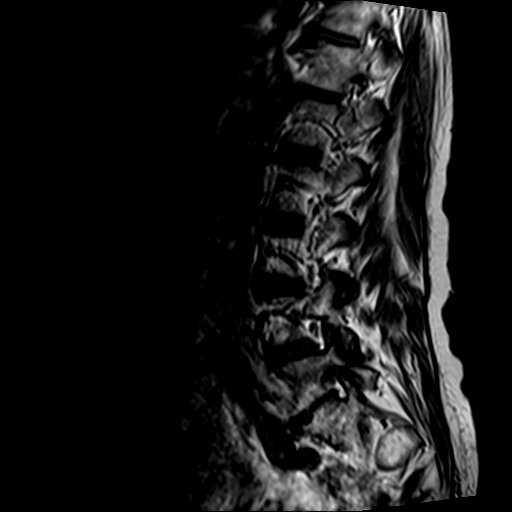
[im 16/16]
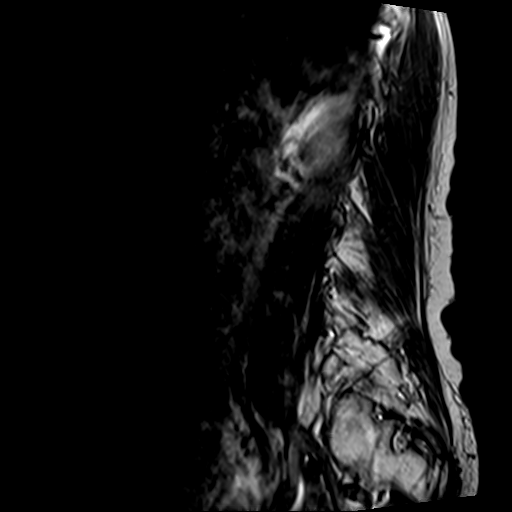

[Series 3: T2 · sagittal · 4.0mm · 0.81mm/px · 7 of 16 slices shown (1 of 2)]
[im 1/16]
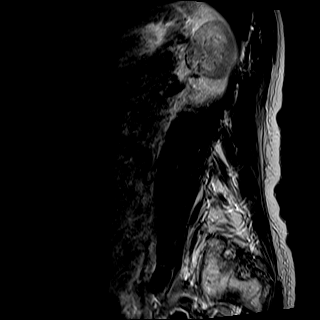
[im 3/16]
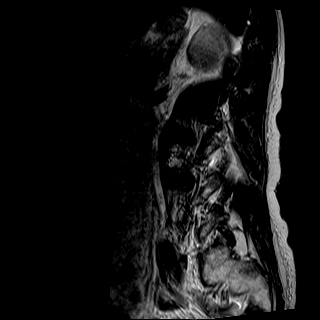
[im 6/16]
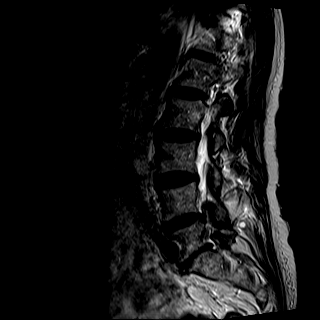
[im 8/16]
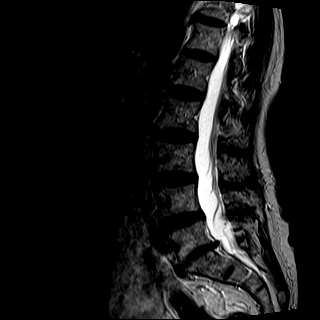
[im 11/16]
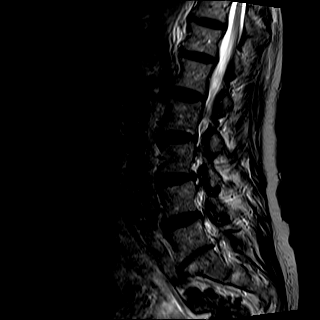
[im 13/16]
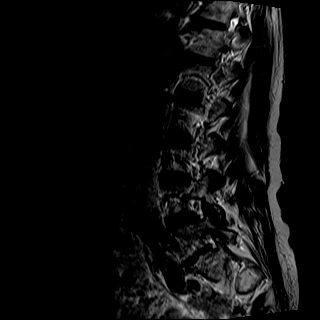
[im 16/16]
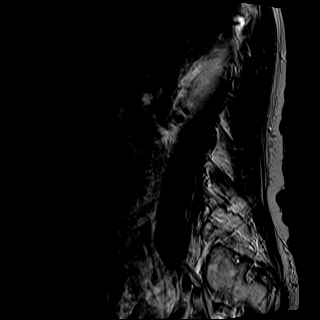

[Series 5: T2 · axial · 4.0mm · 0.39mm/px · z∈[-79,+141]mm · 8 of 36 slices shown (2 of 2)]
[im 1/36]
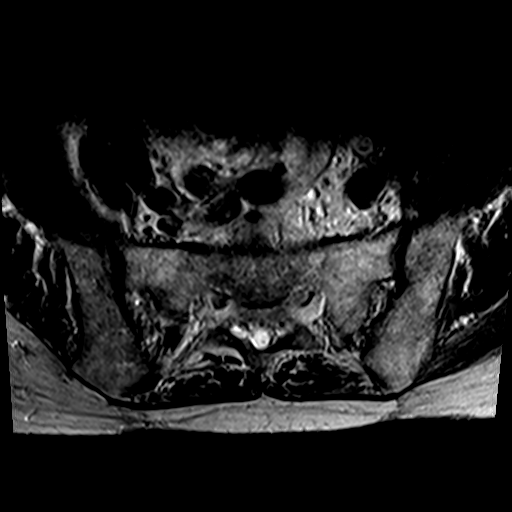
[im 6/36]
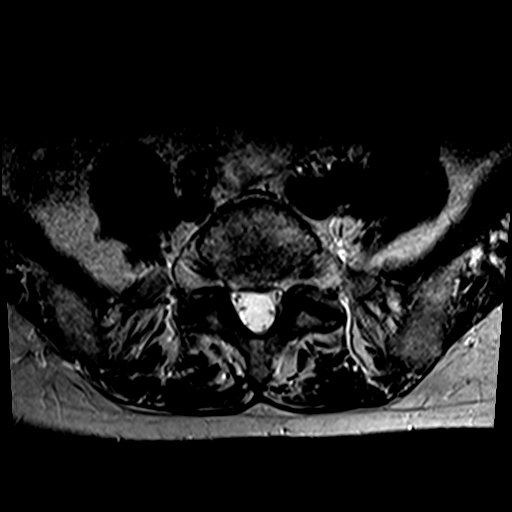
[im 11/36]
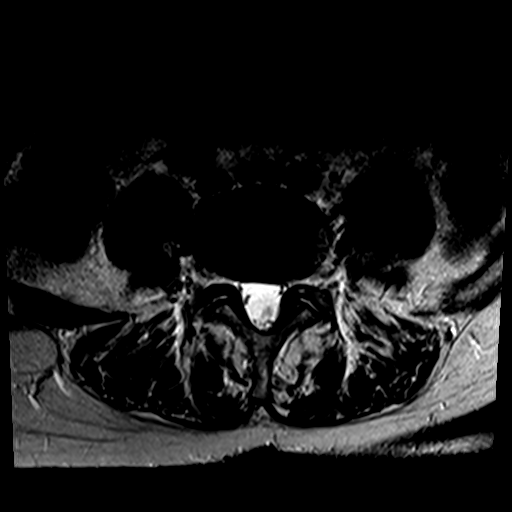
[im 17/36]
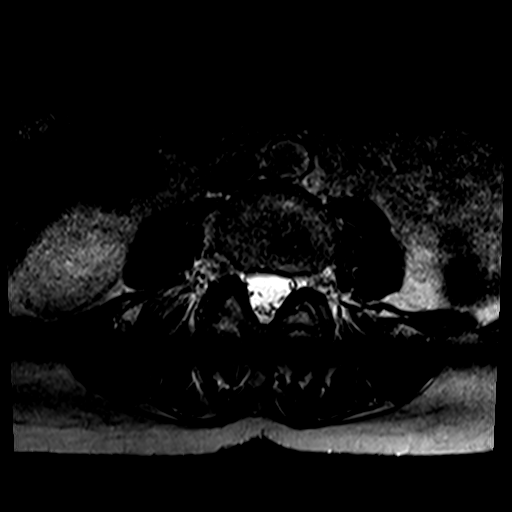
[im 19/36]
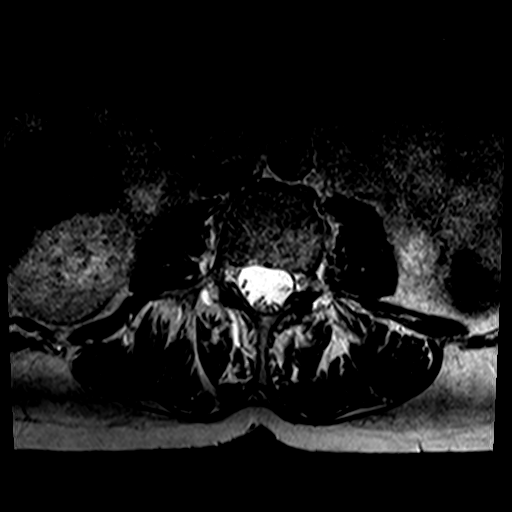
[im 25/36]
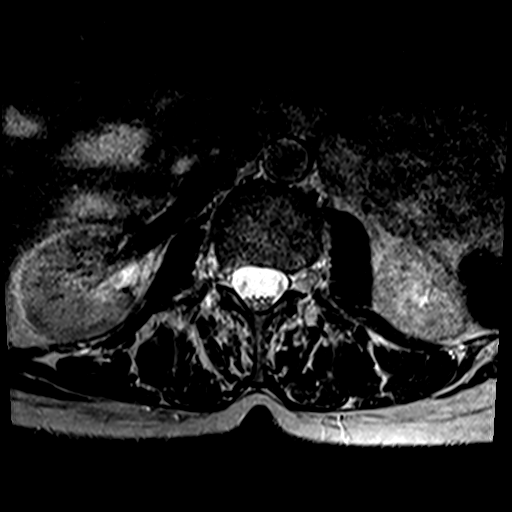
[im 30/36]
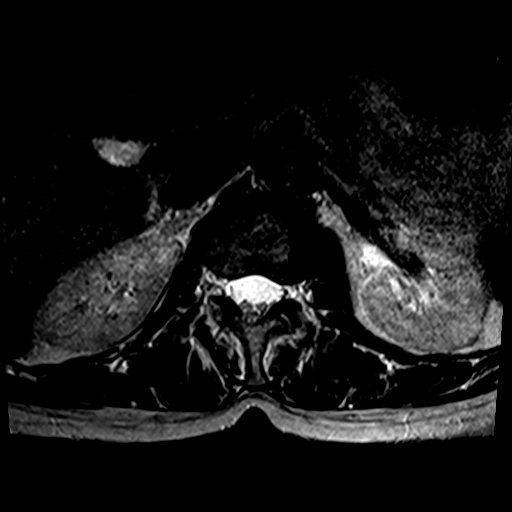
[im 36/36]
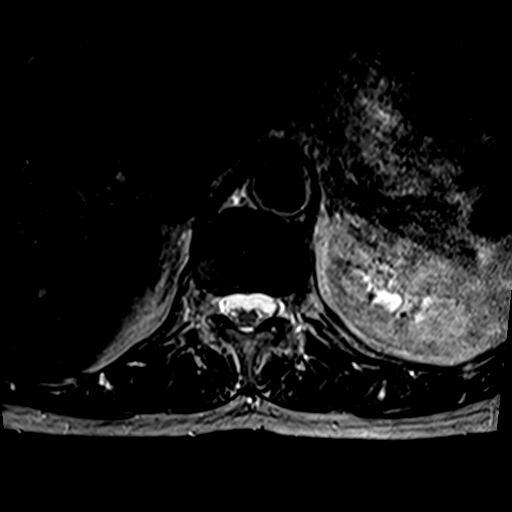

[Series 6: T1 · axial · 4.0mm · 0.78mm/px · z∈[-79,+111]mm · 4 of 36 slices shown (2 of 2)]
[im 1/36]
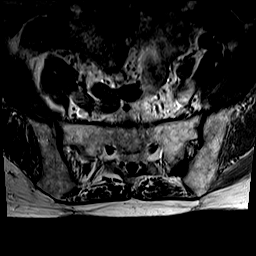
[im 6/36]
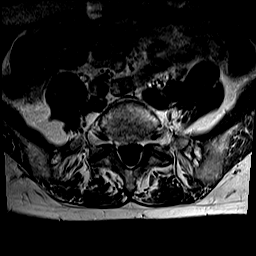
[im 19/36]
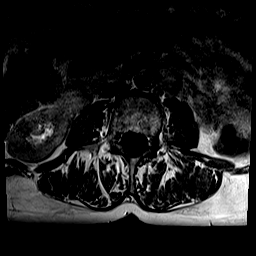
[im 30/36]
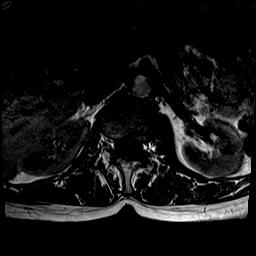

[26 of 48 positions shown; findings below may reference images not displayed]

FINDINGS: Marrow signal is mildly heterogeneous without worrisome lesion.
Vertebral body height and alignment are maintained. Convex left
scoliosis is noted. The conus medullaris is normal in signal and
position. Imaged intra-abdominal contents are unremarkable.

T11-12 is imaged in the sagittal plane only and negative.

T12-L1: Tiny right paracentral protrusion. The central canal and
foramina are widely patent.

L1-2:  Negative.

L2-3:  Minimal bulge without central canal or foraminal narrowing.

L3-4: Minimal disc bulge without central canal or left foraminal
narrowing. Very mild right foraminal narrowing is noted.

L4-5: Shallow disc bulge with some ligamentum flavum thickening on
the left. Very mild narrowing in the left lateral recess is
unchanged. The foramina are open.

L5-S1: The central canal is widely patent. Very mild foraminal
narrowing bilaterally is unchanged.
IMPRESSION: No change in the appearance of the lumbar spine since the comparison
examination.

Tiny right paracentral protrusion T12-L1 without central canal or
foraminal narrowing.

Very mild right foraminal narrowing L3-4 without nerve root
compression.

Mild narrowing in the left lateral recess L4-5 without nerve root
compression.

Mild bilateral L5-S1 foraminal narrowing without nerve root
compression

## 2020-04-09 ENCOUNTER — Emergency Department (HOSPITAL_COMMUNITY)
Admission: EM | Admit: 2020-04-09 | Discharge: 2020-04-09 | Disposition: A | Discharge status: Home / Self Care | Payer: Medicare Other | Attending: Emergency Medicine | Admitting: Emergency Medicine

## 2020-04-09 ENCOUNTER — Other Ambulatory Visit: Payer: Self-pay

## 2020-04-09 DIAGNOSIS — Z888 Allergy status to other drugs, medicaments and biological substances status: Secondary | ICD-10-CM | POA: Diagnosis not present

## 2020-04-09 DIAGNOSIS — N3000 Acute cystitis without hematuria: Secondary | ICD-10-CM | POA: Diagnosis not present

## 2020-04-09 DIAGNOSIS — Z79899 Other long term (current) drug therapy: Secondary | ICD-10-CM | POA: Diagnosis not present

## 2020-04-09 DIAGNOSIS — I10 Essential (primary) hypertension: Secondary | ICD-10-CM | POA: Insufficient documentation

## 2020-04-09 DIAGNOSIS — F0390 Unspecified dementia without behavioral disturbance: Secondary | ICD-10-CM | POA: Insufficient documentation

## 2020-04-09 LAB — COMPREHENSIVE METABOLIC PANEL
ALT: 12 U/L (ref 0–44)
AST: 20 U/L (ref 15–41)
Albumin: 3.9 g/dL (ref 3.5–5.0)
Alkaline Phosphatase: 67 U/L (ref 38–126)
Anion gap: 7 (ref 5–15)
BUN: 11 mg/dL (ref 8–23)
CO2: 27 mmol/L (ref 22–32)
Calcium: 9.3 mg/dL (ref 8.9–10.3)
Chloride: 106 mmol/L (ref 98–111)
Creatinine, Ser: 0.81 mg/dL (ref 0.44–1.00)
GFR calc Af Amer: >60 mL/min (ref >60)
GFR calc non Af Amer: >60 mL/min (ref >60)
Glucose, Bld: 87 mg/dL (ref 70–99)
Potassium: 3.9 mmol/L (ref 3.5–5.1)
Sodium: 140 mmol/L (ref 135–145)
Total Bilirubin: 0.6 mg/dL (ref 0.3–1.2)
Total Protein: 7 g/dL (ref 6.5–8.1)

## 2020-04-09 LAB — URINALYSIS, ROUTINE W REFLEX MICROSCOPIC
Bilirubin Urine: NEGATIVE
Glucose, UA: NEGATIVE mg/dL
Hgb urine dipstick: NEGATIVE
Ketones, ur: NEGATIVE mg/dL
Nitrite: NEGATIVE
Protein, ur: 30 mg/dL — AB
Specific Gravity, Urine: 1.018 (ref 1.005–1.030)
pH: 6 (ref 5.0–8.0)

## 2020-04-09 LAB — CBC WITH DIFFERENTIAL/PLATELET
Abs Immature Granulocytes: 0.02 10*3/uL (ref 0.00–0.07)
Basophils Absolute: 0 10*3/uL (ref 0.0–0.1)
Basophils Relative: 1 %
Eosinophils Absolute: 0.1 10*3/uL (ref 0.0–0.5)
Eosinophils Relative: 2 %
HCT: 37.1 % (ref 36.0–46.0)
Hemoglobin: 12.3 g/dL (ref 12.0–15.0)
Immature Granulocytes: 0 %
Lymphocytes Relative: 25 %
Lymphs Abs: 1.4 10*3/uL (ref 0.7–4.0)
MCH: 31.8 pg (ref 26.0–34.0)
MCHC: 33.2 g/dL (ref 30.0–36.0)
MCV: 95.9 fL (ref 80.0–100.0)
Monocytes Absolute: 0.7 10*3/uL (ref 0.1–1.0)
Monocytes Relative: 13 %
Neutro Abs: 3.4 10*3/uL (ref 1.7–7.7)
Neutrophils Relative %: 59 %
Platelets: 173 10*3/uL (ref 150–400)
RBC: 3.87 MIL/uL (ref 3.87–5.11)
RDW: 12.9 % (ref 11.5–15.5)
WBC: 5.7 10*3/uL (ref 4.0–10.5)
nRBC: 0 % (ref 0.0–0.2)

## 2020-04-09 MED ORDER — HALOPERIDOL LACTATE 5 MG/ML IJ SOLN
2.5000 mg | Freq: Once | INTRAMUSCULAR | Status: AC
  Administered 2020-04-09: 2.5 mg via INTRAVENOUS

## 2020-04-09 MED ORDER — LORAZEPAM 2 MG/ML IJ SOLN
1.0000 mg | Freq: Once | INTRAMUSCULAR | Status: DC
  Filled 2020-04-09: qty 1

## 2020-04-09 MED ORDER — LORAZEPAM 2 MG/ML IJ SOLN
1.0000 mg | Freq: Once | INTRAMUSCULAR | Status: AC
Start: 2020-04-09 — End: 2020-04-09
  Administered 2020-04-09: 1 mg via INTRAVENOUS

## 2020-04-09 MED ORDER — CEFTRIAXONE SODIUM 1 G IJ SOLR
1.0000 g | Freq: Once | INTRAMUSCULAR | Status: AC
  Administered 2020-04-09: 1 g via INTRAVENOUS
  Filled 2020-04-09: qty 10

## 2020-04-09 MED ORDER — HALOPERIDOL LACTATE 5 MG/ML IJ SOLN
5.0000 mg | Freq: Once | INTRAMUSCULAR | Status: DC
  Filled 2020-04-09: qty 1

## 2020-04-09 MED ORDER — CEPHALEXIN 500 MG PO CAPS: 500.0000 mg | ORAL_CAPSULE | Freq: Three times a day (TID) | ORAL | 0 refills | Status: DC

## 2020-04-09 NOTE — ED Notes (Signed)
Lab called to add on urine culture at this time. Lab to add on culture at this time.

## 2020-04-09 NOTE — ED Notes (Addendum)
Pt removed mattress from bed and pushed it in the floor. AxO to place, self. Labile mood.

## 2020-04-09 NOTE — ED Notes (Signed)
Report called back to [facility]

## 2020-04-09 NOTE — ED Notes (Signed)
Pt resting in bed at this time. Daughter remains at bedside.

## 2020-04-09 NOTE — ED Provider Notes (Signed)
[place] DEPT Provider Note   CSN: [id number] Arrival date & time: 04/09/20  1004     History No chief complaint on file.   Angela Griffin is a 79 y.o. female.  HPI    49yF with behavior outbursts. Hx of dementia. Has been acting aggressively recently out of character for her. No new med changes. No fever. Pt it somewhat paranoid. Says there is nothing wrong with her. Questions why she has to be here and wants to leave.   Past Medical History:  Diagnosis Date  . Arthritis   . Diverticulitis   . Hypertension    Patient Active Problem List   Diagnosis Date Noted  . Low back pain radiating to both legs 02/05/2016  . 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 Right hip pain 10/09/2015  . Toe pain, right 09/02/2015  . Right leg pain 07/10/2015  . DDD (degenerative disc disease), lumbar 04/02/2015  . Pneumococcal vaccination declined 03/15/2015  . LBP (low back pain) 03/07/2015  . Change in blood platelet count 04/17/2014  . Arthritis, degenerative 04/17/2014  . Cannot sleep 04/17/2014  . Hemorrhoid 04/17/2014  . 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 Benign essential HTN 04/17/2014  . 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 Anxiety disorder 04/17/2014  . Abnormal thyroid stimulating hormone (TSH) level 04/17/2014  . Thrombocytopenia ([place]) 04/17/2014   No past surgical history on file.   OB History   No obstetric history on file.    No family history on file.  Social History   Tobacco Use  . Smoking status: Never Smoker  Substance Use Topics  . Alcohol use: Yes    Alcohol/week: 0.0 standard drinks    Comment: occasional glass of wine  . Drug use: No    Home Medications Prior to Admission medications   Medication Sig Start Date End Date Taking? Authorizing Provider  ALPRAZolam (XANAX) 0.25 MG tablet Take 0.25 mg by mouth 2 (two) times daily as needed for anxiety.   Yes [provider]  ALPRAZolam Duanne Moron) 0.5 MG tablet Take 0.5 mg by mouth  at bedtime as needed for anxiety.   Yes [provider]  donepezil (ARICEPT) 10 MG tablet Take 10 mg by mouth daily.   Yes [provider]  escitalopram (LEXAPRO) 10 MG tablet Take 10 mg by mouth daily.   Yes [provider]  gabapentin (NEURONTIN) 300 MG capsule Take 300 mg by mouth 3 (three) times daily.   Yes [provider]  lisinopril (PRINIVIL,ZESTRIL) 40 MG tablet Take 20 mg by mouth in the morning and at bedtime.  06/17/15  Yes [provider]  loratadine (CLARITIN) 10 MG tablet Take 10 mg by mouth daily.   Yes [provider]  memantine (NAMENDA) 5 MG tablet Take 5 mg by mouth daily. Starting 6.19.21 will begin 5mg  bid   Yes [provider]  PRESCRIPTION MEDICATION Take 1 Bottle by mouth 2 (two) times daily with a meal. Magic Cup   Yes [provider]  psyllium (REGULOID) 0.52 g capsule Take 0.52 g by mouth daily as needed (constipation).   Yes [provider]  risperiDONE (RISPERDAL) 0.25 MG tablet Take 0.25 mg by mouth 2 (two) times daily. [id number] Start date of 6.15.21   Yes [provider]  traZODone (DESYREL) 100 MG tablet Take 100 mg by mouth at bedtime.  04/06/15  Yes [provider]  diclofenac (VOLTAREN) 75 MG EC tablet Take 1 tablet (75 mg total) by mouth 2 (two) times daily. Patient  not taking: Reported on 04/09/2020 10/10/15   Lenda Kelp, MD  nortriptyline (PAMELOR) 25 MG capsule Take 1 capsule (25 mg total) by mouth at bedtime. Patient not taking: Reported on 04/09/2020 10/10/15   Lenda Kelp, MD    Allergies    Prednisone  Review of Systems   Review of Systems Level 5 caveat because of dementia.   Physical Exam Updated Vital Signs BP (!) 150/97   Pulse 96   Temp 99.4 F (37.4 C)   SpO2 92%   Physical Exam Vitals and nursing note reviewed.  Constitutional:      General: She is not in acute distress.    Appearance: She is well-developed.  HENT:     Head:  Normocephalic and atraumatic.  Eyes:     General:        Right eye: No discharge.        Left eye: No discharge.     Conjunctiva/sclera: Conjunctivae normal.  Cardiovascular:     Rate and Rhythm: Normal rate and regular rhythm.     Heart sounds: Normal heart sounds. No murmur heard.  No friction rub. No gallop.   Pulmonary:     Effort: Pulmonary effort is normal. No respiratory distress.     Breath sounds: Normal breath sounds.  Abdominal:     General: There is no distension.     Palpations: Abdomen is soft.     Tenderness: There is no abdominal tenderness.  Musculoskeletal:        General: No tenderness.     Cervical back: Neck supple.  Skin:    General: Skin is warm and dry.  Neurological:     Mental Status: She is alert.  Psychiatric:     Comments: Awake. Alert. Confused to time. Aware that she is in the hospital but not sure of the reason why. Apprehensive of being examined. Repeated attempts to walk out of room. Hard to redirect.      ED Results / Procedures / Treatments   Labs (all labs ordered are listed, but only abnormal results are displayed) Labs Reviewed  URINE CULTURE - Abnormal; Notable for the following components:      Result Value   Culture   (*)    Value: <10,000 COLONIES/mL INSIGNIFICANT GROWTH Performed at [facility], [address] [place], [place] [address]    All other components within normal limits  URINALYSIS, ROUTINE W REFLEX MICROSCOPIC - Abnormal; Notable for the following components:   Protein, ur 30 (*)    Leukocytes,Ua MODERATE (*)    Bacteria, UA RARE (*)    All other components within normal limits  CBC WITH DIFFERENTIAL/PLATELET  COMPREHENSIVE METABOLIC PANEL    EKG None  Radiology No results found.  Procedures Procedures (including critical care time)  Medications Ordered in ED Medications  LORazepam (ATIVAN) injection 1 mg (1 mg Intravenous Given 04/09/20 1126)  haloperidol lactate (HALDOL) injection 2.5 mg (2.5  mg Intravenous Given 04/09/20 1129)  cefTRIAXone (ROCEPHIN) 1 g in sodium chloride 0.9 % 100 mL IVPB (0 g Intravenous Stopped 04/09/20 1304)    ED Course  I have reviewed the triage vital signs and the nursing notes.  Pertinent labs & imaging results that were available during my care of the patient were reviewed by me and considered in my medical decision making (see chart for details).    MDM Rules/Calculators/A&P  Tried calling daughter Debarah Crape, [phone], for collateral information but no answer. She is agitated. Swung at me when I was examining her. She is taking the mattress off the stretcher. Trying to leave, etc. Unfortunately, we're going to have to sedate her to obtain testing.   Possible UTI? No obvious alternative medical explanation at this point. Will send culture. Continue abx.    Final Clinical Impression(s) / ED Diagnoses Final diagnoses:  Acute cystitis without hematuria    Rx / DC Orders ED Discharge Orders    None       Raeford Razor, MD 04/12/20 1214

## 2020-04-09 NOTE — ED Triage Notes (Addendum)
[facility] transported pt from [facility] at [place] and reports the following:  Nursing facility said her temp 100.3. EMS temp 98.5. They also report increasing agitation, aggression, anxiety since yesterday. They said she has been destructive in her room. Daughter Debarah Crape is on the to [facility] ED. Hx dementia, HTN, anxiety, insomnia. She took her medications at the facility this morning.

## 2020-04-09 NOTE — ED Notes (Signed)
Pt calm and resting in bed at this time. Daughter remains at bedside. Sitter remains at bedside. IV obtained, lab work sent to lab. IV wrapped with gauze and coban to prevent the pt from pulling at IV. Pt cooperative at this time.

## 2020-04-10 LAB — URINE CULTURE: Culture: <10000 — AB

## 2020-06-16 ENCOUNTER — Encounter (HOSPITAL_COMMUNITY): Payer: Self-pay | Admitting: Emergency Medicine

## 2020-06-16 ENCOUNTER — Other Ambulatory Visit: Payer: Self-pay

## 2020-06-16 ENCOUNTER — Emergency Department (HOSPITAL_COMMUNITY)
Admission: EM | Admit: 2020-06-16 | Discharge: 2020-06-16 | Disposition: A | Discharge status: Skilled Nursing Facility | Payer: Medicare Other | Attending: Emergency Medicine | Admitting: Emergency Medicine

## 2020-06-16 DIAGNOSIS — F0391 Unspecified dementia with behavioral disturbance: Secondary | ICD-10-CM

## 2020-06-16 DIAGNOSIS — R82998 Other abnormal findings in urine: Secondary | ICD-10-CM

## 2020-06-16 DIAGNOSIS — I10 Essential (primary) hypertension: Secondary | ICD-10-CM | POA: Diagnosis not present

## 2020-06-16 DIAGNOSIS — Z79899 Other long term (current) drug therapy: Secondary | ICD-10-CM | POA: Diagnosis not present

## 2020-06-16 DIAGNOSIS — R456 Violent behavior: Secondary | ICD-10-CM | POA: Insufficient documentation

## 2020-06-16 LAB — URINALYSIS, ROUTINE W REFLEX MICROSCOPIC
Bilirubin Urine: NEGATIVE
Glucose, UA: NEGATIVE mg/dL
Ketones, ur: NEGATIVE mg/dL
Nitrite: NEGATIVE
Protein, ur: 100 mg/dL — AB
Specific Gravity, Urine: >1.03 — ABNORMAL HIGH (ref 1.005–1.030)
pH: 6 (ref 5.0–8.0)

## 2020-06-16 LAB — BASIC METABOLIC PANEL
Anion gap: 10 (ref 5–15)
BUN: 15 mg/dL (ref 8–23)
CO2: 25 mmol/L (ref 22–32)
Calcium: 9.3 mg/dL (ref 8.9–10.3)
Chloride: 105 mmol/L (ref 98–111)
Creatinine, Ser: 0.91 mg/dL (ref 0.44–1.00)
GFR calc Af Amer: >60 mL/min (ref >60)
GFR calc non Af Amer: >60 mL/min (ref >60)
Glucose, Bld: 111 mg/dL — ABNORMAL HIGH (ref 70–99)
Potassium: 3.8 mmol/L (ref 3.5–5.1)
Sodium: 140 mmol/L (ref 135–145)

## 2020-06-16 LAB — CBC
HCT: 38.8 % (ref 36.0–46.0)
Hemoglobin: 12.8 g/dL (ref 12.0–15.0)
MCH: 31.2 pg (ref 26.0–34.0)
MCHC: 33 g/dL (ref 30.0–36.0)
MCV: 94.6 fL (ref 80.0–100.0)
Platelets: 184 10*3/uL (ref 150–400)
RBC: 4.1 MIL/uL (ref 3.87–5.11)
RDW: 13.2 % (ref 11.5–15.5)
WBC: 5.5 10*3/uL (ref 4.0–10.5)
nRBC: 0 % (ref 0.0–0.2)

## 2020-06-16 LAB — URINALYSIS, MICROSCOPIC (REFLEX): Bacteria, UA: NONE SEEN

## 2020-06-16 MED ORDER — CEPHALEXIN 500 MG PO CAPS
500.0000 mg | ORAL_CAPSULE | Freq: Once | ORAL | Status: AC
  Administered 2020-06-16: 500 mg via ORAL
  Filled 2020-06-16: qty 1

## 2020-06-16 MED ORDER — CEPHALEXIN 500 MG PO CAPS: 500.0000 mg | ORAL_CAPSULE | Freq: Three times a day (TID) | ORAL | 0 refills | Status: DC

## 2020-06-16 NOTE — ED Notes (Signed)
Family at bedside. 

## 2020-06-16 NOTE — ED Provider Notes (Signed)
[facility]-EMERGENCY DEPT Provider Note   CSN: [id number] Arrival date & time: 06/16/20  1257     History Chief Complaint  Patient presents with  . Aggressive Behavior    Angela Griffin is a 79 y.o. female.  Patient with hx dementia, presents to ED via EMS from [facility] as was noted to have agitated behavior earlier today, refusing to take meds. On arrival to ED pt is calm and cooperative. Patient denies any specific c/o, indicates feels fine/normal. Pt is limited historian, dementia - level 5 caveat. No report of trauma or fall. No report of fevers or other acute illness. No noted change in meds.   The history is provided by the patient and the EMS personnel. The history is limited by the condition of the patient.       Past Medical History:  Diagnosis Date  . Arthritis   . Diverticulitis   . Hypertension     Patient Active Problem List   Diagnosis Date Noted  . Low back pain radiating to both legs 02/05/2016  . 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 Right hip pain 10/09/2015  . Toe pain, right 09/02/2015  . Right leg pain 07/10/2015  . DDD (degenerative disc disease), lumbar 04/02/2015  . Pneumococcal vaccination declined 03/15/2015  . LBP (low back pain) 03/07/2015  . Change in blood platelet count 04/17/2014  . Arthritis, degenerative 04/17/2014  . Cannot sleep 04/17/2014  . Hemorrhoid 04/17/2014  . 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 Benign essential HTN 04/17/2014  . 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 Anxiety disorder 04/17/2014  . Abnormal thyroid stimulating hormone (TSH) level 04/17/2014  . Thrombocytopenia (HCC) 04/17/2014    History reviewed. No pertinent surgical history.   OB History   No obstetric history on file.     No family history on file.  Social History   Tobacco Use  . Smoking status: Never Smoker  Substance Use Topics  . Alcohol use: Yes    Alcohol/week: 0.0 standard drinks    Comment: occasional glass of wine  . Drug use:  No    Home Medications Prior to Admission medications   Medication Sig Start Date End Date Taking? Authorizing Provider  ALPRAZolam (XANAX) 0.25 MG tablet Take 0.25 mg by mouth 2 (two) times daily as needed for anxiety.    [provider]  ALPRAZolam Prudy Feeler) 0.5 MG tablet Take 0.5 mg by mouth at bedtime as needed for anxiety.    [provider]  cephALEXin (KEFLEX) 500 MG capsule Take 1 capsule (500 mg total) by mouth 3 (three) times daily. 04/09/20   Raeford Razor, MD  diclofenac (VOLTAREN) 75 MG EC tablet Take 1 tablet (75 mg total) by mouth 2 (two) times daily. Patient not taking: Reported on 04/09/2020 10/10/15   Lenda Kelp, MD  donepezil (ARICEPT) 10 MG tablet Take 10 mg by mouth daily.    [provider]  escitalopram (LEXAPRO) 10 MG tablet Take 10 mg by mouth daily.    [provider]  gabapentin (NEURONTIN) 300 MG capsule Take 300 mg by mouth 3 (three) times daily.    [provider]  lisinopril (PRINIVIL,ZESTRIL) 40 MG tablet Take 20 mg by mouth in the morning and at bedtime.  06/17/15   [provider]  loratadine (CLARITIN) 10 MG tablet Take 10 mg by mouth daily.    [provider]  memantine (NAMENDA) 5 MG tablet Take 5 mg by mouth daily. Starting 6.19.21 will begin 5mg  bid    [provider]  nortriptyline (PAMELOR) 25 MG capsule Take 1 capsule (25 mg total) by mouth at bedtime. Patient not taking: Reported on 04/09/2020 10/10/15   Lenda Kelp, MD  PRESCRIPTION MEDICATION Take 1 Bottle by mouth 2 (two) times daily with a meal. Magic Cup    [provider]  psyllium (REGULOID) 0.52 g capsule Take 0.52 g by mouth daily as needed (constipation).    [provider]  risperiDONE (RISPERDAL) 0.25 MG tablet Take 0.25 mg by mouth 2 (two) times daily. [id number] Start date of 6.15.21    [provider]  traZODone (DESYREL) 100 MG tablet Take 100 mg by mouth at bedtime.  04/06/15    [provider]    Allergies    Prednisone  Review of Systems   Review of Systems  Unable to perform ROS: Dementia  Constitutional: Negative for fever.  HENT: Negative for sore throat.   Eyes: Negative for visual disturbance.  Respiratory: Negative for cough and shortness of breath.   Cardiovascular: Negative for chest pain.  Gastrointestinal: Negative for abdominal pain, diarrhea and vomiting.  Genitourinary: Negative for dysuria and flank pain.  Musculoskeletal: Negative for back pain and neck pain.  Skin: Negative for rash.  Neurological: Negative for headaches.  Hematological: Does not bruise/bleed easily.    Physical Exam Updated Vital Signs BP (!) 159/62   Pulse 72   Temp 98.7 F (37.1 C)   Resp 19   SpO2 96%   Physical Exam Vitals and nursing note reviewed.  Constitutional:      Appearance: Normal appearance. She is well-developed.  HENT:     Head: Atraumatic.     Nose: Nose normal.     Mouth/Throat:     Mouth: Mucous membranes are moist.  Eyes:     General: No scleral icterus.    Conjunctiva/sclera: Conjunctivae normal.     Pupils: Pupils are equal, round, and reactive to light.  Neck:     Trachea: No tracheal deviation.     Comments: No stiffness or rigidity.  Cardiovascular:     Rate and Rhythm: Normal rate and regular rhythm.     Pulses: Normal pulses.     Heart sounds: Normal heart sounds. No murmur heard.  No friction rub. No gallop.   Pulmonary:     Effort: Pulmonary effort is normal. No respiratory distress.     Breath sounds: Normal breath sounds.  Abdominal:     General: Bowel sounds are normal. There is no distension.     Palpations: Abdomen is soft. There is no mass.     Tenderness: There is no abdominal tenderness. There is no guarding or rebound.     Hernia: No hernia is present.  Genitourinary:    Comments: No cva tenderness.  Musculoskeletal:        General: No swelling or tenderness.     Cervical back: Normal range of  motion and neck supple. No rigidity. No muscular tenderness.     Right lower leg: No edema.     Left lower leg: No edema.  Skin:    General: Skin is warm and dry.     Findings: No rash.  Neurological:     Mental Status: She is alert.     Comments: Alert, speech normal. Moves bilateral extremities purposefully. Ambulates w steady gait.   Psychiatric:        Mood and Affect: Mood normal.     ED Results / Procedures / Treatments   Labs (all labs  ordered are listed, but only abnormal results are displayed) Results for orders placed or performed during the hospital encounter of 06/16/20  Basic metabolic panel  Result Value Ref Range   Sodium 140 135 - 145 mmol/L   Potassium 3.8 3.5 - 5.1 mmol/L   Chloride 105 98 - 111 mmol/L   CO2 25 22 - 32 mmol/L   Glucose, Bld 111 (H) 70 - 99 mg/dL   BUN 15 8 - 23 mg/dL   Creatinine, Ser 8.67 0.44 - 1.00 mg/dL   Calcium 9.3 8.9 - 67.2 mg/dL   GFR calc non Af Amer >60 >60 mL/min   GFR calc Af Amer >60 >60 mL/min   Anion gap 10 5 - 15  CBC  Result Value Ref Range   WBC 5.5 4.0 - 10.5 K/uL   RBC 4.10 3.87 - 5.11 MIL/uL   Hemoglobin 12.8 12.0 - 15.0 g/dL   HCT 09.4 36 - 46 %   MCV 94.6 80.0 - 100.0 fL   MCH 31.2 26.0 - 34.0 pg   MCHC 33.0 30.0 - 36.0 g/dL   RDW 70.9 62.8 - 36.6 %   Platelets 184 150 - 400 K/uL   nRBC 0.0 0.0 - 0.2 %  Urinalysis, Routine w reflex microscopic  Result Value Ref Range   Color, Urine YELLOW YELLOW   APPearance CLEAR CLEAR   Specific Gravity, Urine >1.030 (H) 1.005 - 1.030   pH 6.0 5.0 - 8.0   Glucose, UA NEGATIVE NEGATIVE mg/dL   Hgb urine dipstick TRACE (A) NEGATIVE   Bilirubin Urine NEGATIVE NEGATIVE   Ketones, ur NEGATIVE NEGATIVE mg/dL   Protein, ur 294 (A) NEGATIVE mg/dL   Nitrite NEGATIVE NEGATIVE   Leukocytes,Ua SMALL (A) NEGATIVE  Urinalysis, Microscopic (reflex)  Result Value Ref Range   RBC / HPF 0-5 0 - 5 RBC/hpf   WBC, UA 11-20 0 - 5 WBC/hpf   Bacteria, UA NONE SEEN NONE SEEN   Squamous  Epithelial / LPF 0-5 0 - 5   Non Squamous Epithelial PRESENT (A) NONE SEEN    EKG None  Radiology No results found.  Procedures Procedures (including critical care time)  Medications Ordered in ED Medications - No data to display  ED Course  I have reviewed the triage vital signs and the nursing notes.  Pertinent labs & imaging results that were available during my care of the patient were reviewed by me and considered in my medical decision making (see chart for details).    MDM Rules/Calculators/A&P                         Labs sent.   Patient asking for something to eat/drink - provided to her.  Reviewed nursing notes and prior charts for additional history.   Labs reviewed/interpreted by me - chem normal. Wbc normal. UA w possible uti w 11-20 wbc. Will culture and rx.  Keflex po.  Recheck pt - remains alert, cooperative, content, not toxic or ill appearing, not agitated.   Pt currently appears stable for d/c.  Return precautions provided.     Final Clinical Impression(s) / ED Diagnoses Final diagnoses:  None    Rx / DC Orders ED Discharge Orders    None       Cathren Laine, MD 06/16/20 1515

## 2020-06-16 NOTE — ED Notes (Signed)
Patient ambulatory to restroom with stand by assist.

## 2020-06-16 NOTE — ED Notes (Signed)
Patient given soup, crackers, and tea.

## 2020-06-16 NOTE — ED Triage Notes (Signed)
Per EMS, patient from [facility], staff reports worsening aggressive behavior since yesterday. Patient calm and cooperative. Ambulatory independently. Fall alarm applied.

## 2020-06-16 NOTE — Discharge Instructions (Addendum)
It was our pleasure to provide your ER care today - we hope that you feel better.  The lab tests show a possible urine infection - take antibiotic as prescribed.   Follow up with your doctor in the next 1-2 weeks. Also follow up with your doctor regarding behavioral symptoms.   Return to ER if worse, new symptoms, high fevers, trouble breathing, or other emergency.

## 2020-06-18 LAB — URINE CULTURE: Culture: 10000 — AB

## 2020-06-19 ENCOUNTER — Telehealth: Payer: Self-pay | Admitting: Emergency Medicine

## 2020-06-19 NOTE — Telephone Encounter (Signed)
Post ED Visit - Positive Culture Follow-up  Culture report reviewed by antimicrobial stewardship pharmacist: [facility] []  , Pharm.D. []  Enzo Bi, .D., BCPS AQ-ID []  Celedonio Miyamoto, Pharm.D., BCPS []  [address], Pharm.D., BCPS []  [place], Garvin Fila.D., BCPS, AAHIVP []  , Pharm.D., BCPS, AAHIVP []  Georgina Pillion, PharmD, BCPS []  , PharmD, BCPS []  [place], PharmD, BCPS []  [place], PharmD []  , PharmD, BCPS []  Estella Husk, PharmD  Pharmacy Team []  Lysle Pearl, PharmD []  , PharmD []  Phillips Climes, PharmD []  , Rph []  Agapito Games) , PharmD []  Verlan Friends, PharmD []  , PharmD []  Mervyn Gay, PharmD []  , PharmD [x]  Vinnie Level, PharmD []  [facility], PharmD []  , PharmD []  Len Childs, PharmD   Positive urine culture Treated with cephalexin, organism sensitive to the same and no further patient follow-up is required at this time.  06/19/2020, 11:51 AM

## 2020-06-21 ENCOUNTER — Other Ambulatory Visit: Payer: Self-pay

## 2020-06-21 ENCOUNTER — Emergency Department (HOSPITAL_COMMUNITY)
Admission: EM | Admit: 2020-06-21 | Discharge: 2020-06-22 | Disposition: A | Discharge status: Skilled Nursing Facility | Payer: Medicare Other | Attending: Emergency Medicine | Admitting: Emergency Medicine

## 2020-06-21 DIAGNOSIS — F0391 Unspecified dementia with behavioral disturbance: Secondary | ICD-10-CM | POA: Diagnosis not present

## 2020-06-21 DIAGNOSIS — I10 Essential (primary) hypertension: Secondary | ICD-10-CM | POA: Diagnosis not present

## 2020-06-21 DIAGNOSIS — R451 Restlessness and agitation: Secondary | ICD-10-CM | POA: Diagnosis present

## 2020-06-21 DIAGNOSIS — Z79899 Other long term (current) drug therapy: Secondary | ICD-10-CM | POA: Diagnosis not present

## 2020-06-21 LAB — BASIC METABOLIC PANEL
Anion gap: 9 (ref 5–15)
BUN: 14 mg/dL (ref 8–23)
CO2: 23 mmol/L (ref 22–32)
Calcium: 9.7 mg/dL (ref 8.9–10.3)
Chloride: 109 mmol/L (ref 98–111)
Creatinine, Ser: 1.01 mg/dL — ABNORMAL HIGH (ref 0.44–1.00)
GFR calc Af Amer: >60 mL/min (ref >60)
GFR calc non Af Amer: 53 mL/min — ABNORMAL LOW (ref >60)
Glucose, Bld: 104 mg/dL — ABNORMAL HIGH (ref 70–99)
Potassium: 4.6 mmol/L (ref 3.5–5.1)
Sodium: 141 mmol/L (ref 135–145)

## 2020-06-21 LAB — CBC WITH DIFFERENTIAL/PLATELET
Abs Immature Granulocytes: 0.03 10*3/uL (ref 0.00–0.07)
Basophils Absolute: 0.1 10*3/uL (ref 0.0–0.1)
Basophils Relative: 1 %
Eosinophils Absolute: 0 10*3/uL (ref 0.0–0.5)
Eosinophils Relative: 0 %
HCT: 40.8 % (ref 36.0–46.0)
Hemoglobin: 13.2 g/dL (ref 12.0–15.0)
Immature Granulocytes: 0 %
Lymphocytes Relative: 16 %
Lymphs Abs: 1.5 10*3/uL (ref 0.7–4.0)
MCH: 31 pg (ref 26.0–34.0)
MCHC: 32.4 g/dL (ref 30.0–36.0)
MCV: 95.8 fL (ref 80.0–100.0)
Monocytes Absolute: 1 10*3/uL (ref 0.1–1.0)
Monocytes Relative: 11 %
Neutro Abs: 6.7 10*3/uL (ref 1.7–7.7)
Neutrophils Relative %: 72 %
Platelets: 202 10*3/uL (ref 150–400)
RBC: 4.26 MIL/uL (ref 3.87–5.11)
RDW: 13.3 % (ref 11.5–15.5)
WBC: 9.3 10*3/uL (ref 4.0–10.5)
nRBC: 0 % (ref 0.0–0.2)

## 2020-06-21 LAB — URINALYSIS, ROUTINE W REFLEX MICROSCOPIC
Bilirubin Urine: NEGATIVE
Glucose, UA: NEGATIVE mg/dL
Ketones, ur: NEGATIVE mg/dL
Leukocytes,Ua: NEGATIVE
Nitrite: NEGATIVE
Protein, ur: 100 mg/dL — AB
Specific Gravity, Urine: 1.015 (ref 1.005–1.030)
pH: 6 (ref 5.0–8.0)

## 2020-06-21 MED ORDER — HALOPERIDOL LACTATE 5 MG/ML IJ SOLN
2.5000 mg | Freq: Once | INTRAMUSCULAR | Status: AC
  Administered 2020-06-21: 2.5 mg via INTRAMUSCULAR
  Filled 2020-06-21: qty 1

## 2020-06-21 MED ORDER — LORAZEPAM 2 MG/ML IJ SOLN: 1.0000 mg | Freq: Once | INTRAMUSCULAR | Status: AC

## 2020-06-21 MED ORDER — LORAZEPAM 2 MG/ML IJ SOLN
1.0000 mg | Freq: Once | INTRAMUSCULAR | Status: AC
  Administered 2020-06-21: 1 mg via INTRAMUSCULAR
  Filled 2020-06-21: qty 1

## 2020-06-21 MED ORDER — LORAZEPAM 2 MG/ML IJ SOLN
INTRAMUSCULAR | Status: AC
  Administered 2020-06-21: 1 mg via INTRAMUSCULAR
  Filled 2020-06-21: qty 1

## 2020-06-21 NOTE — ED Triage Notes (Signed)
Pt BIBA from [facility]  Pt recently dx with UTI.  Staff reports pt with ongoing aggressive behavior (throwing things at residents and staff).  EMS reports pt was calmly sitting in common area when they arrived, notes bruising around left thumb.   Hx of dementia. Pt alert at baseline.

## 2020-06-21 NOTE — Discharge Instructions (Addendum)
Please return for any problem.  °

## 2020-06-21 NOTE — ED Provider Notes (Signed)
[facility]-EMERGENCY DEPT Provider Note   CSN: [id number] Arrival date & time: 06/21/20  1812     History No chief complaint on file.   Angela Griffin is a 79 y.o. female.  79 year old female with prior medical history as detailed below presents for evaluation of agitation.  Patient with longstanding history of dementia.  Patient with recent diagnosis of UTI treated with Keflex.  Per EMS patient was alert and cooperative upon their initial arrival at [facility].  Patient had no specific complaint upon my initial evaluation.  Shortly after her initial evaluation the patient became agitated and required chemical and physical restraint.  The history is provided by the patient.  Illness Location:  Dementia, agitation Severity:  Moderate Onset quality:  Unable to specify Timing:  Constant Progression:  Waxing and waning Chronicity:  Recurrent      Past Medical History:  Diagnosis Date  . Arthritis   . Diverticulitis   . Hypertension     Patient Active Problem List   Diagnosis Date Noted  . Low back pain radiating to both legs 02/05/2016  . 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 Right hip pain 10/09/2015  . Toe pain, right 09/02/2015  . Right leg pain 07/10/2015  . DDD (degenerative disc disease), lumbar 04/02/2015  . Pneumococcal vaccination declined 03/15/2015  . LBP (low back pain) 03/07/2015  . Change in blood platelet count 04/17/2014  . Arthritis, degenerative 04/17/2014  . Cannot sleep 04/17/2014  . Hemorrhoid 04/17/2014  . 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 Benign essential HTN 04/17/2014  . 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 Anxiety disorder 04/17/2014  . Abnormal thyroid stimulating hormone (TSH) level 04/17/2014  . Thrombocytopenia (HCC) 04/17/2014    No past surgical history on file.   OB History   No obstetric history on file.     No family history on file.  Social History   Tobacco Use  . Smoking status: Never Smoker    Substance Use Topics  . Alcohol use: Yes    Alcohol/week: 0.0 standard drinks    Comment: occasional glass of wine  . Drug use: No    Home Medications Prior to Admission medications   Medication Sig Start Date End Date Taking? Authorizing Provider  cephALEXin (KEFLEX) 500 MG capsule Take 1 capsule (500 mg total) by mouth 3 (three) times daily. 06/16/20  Yes Cathren Laine, MD  donepezil (ARICEPT) 10 MG tablet Take 10 mg by mouth daily.   Yes [provider]  escitalopram (LEXAPRO) 10 MG tablet Take 10 mg by mouth daily.   Yes [provider]  gabapentin (NEURONTIN) 300 MG capsule Take 300 mg by mouth 3 (three) times daily.   Yes [provider]  lisinopril (PRINIVIL,ZESTRIL) 40 MG tablet Take 20 mg by mouth in the morning and at bedtime.  06/17/15  Yes [provider]  loratadine (CLARITIN) 10 MG tablet Take 10 mg by mouth daily.   Yes [provider]  memantine (NAMENDA) 5 MG tablet Take 5 mg by mouth 2 (two) times daily.    Yes [provider]  risperiDONE (RISPERDAL) 0.25 MG tablet Take 0.25 mg by mouth 2 (two) times daily.    Yes [provider]  traZODone (DESYREL) 100 MG tablet Take 100 mg by mouth at bedtime.  04/06/15  Yes [provider]  cephALEXin (KEFLEX) 500 MG capsule Take 1 capsule (500 mg total) by mouth 3 (three) times daily. Patient not taking: Reported on 06/21/2020 04/09/20   Raeford Razor,  MD  diclofenac (VOLTAREN) 75 MG EC tablet Take 1 tablet (75 mg total) by mouth 2 (two) times daily. Patient not taking: Reported on 04/09/2020 10/10/15   Lenda Kelp, MD  nortriptyline (PAMELOR) 25 MG capsule Take 1 capsule (25 mg total) by mouth at bedtime. Patient not taking: Reported on 04/09/2020 10/10/15   Lenda Kelp, MD    Allergies    Prednisone  Review of Systems   Review of Systems  Unable to perform ROS: Dementia    Physical Exam Updated Vital Signs BP (!) 166/139   Pulse 99   Temp  98.2 F (36.8 C) (Oral)   Resp (!) 26   SpO2 96%   Physical Exam Vitals and nursing note reviewed.  Constitutional:      General: She is not in acute distress.    Appearance: She is well-developed.  HENT:     Head: Normocephalic and atraumatic.  Eyes:     Conjunctiva/sclera: Conjunctivae normal.     Pupils: Pupils are equal, round, and reactive to light.  Cardiovascular:     Rate and Rhythm: Normal rate and regular rhythm.     Heart sounds: Normal heart sounds.  Pulmonary:     Effort: Pulmonary effort is normal. No respiratory distress.     Breath sounds: Normal breath sounds.  Abdominal:     General: There is no distension.     Palpations: Abdomen is soft.     Tenderness: There is no abdominal tenderness.  Musculoskeletal:        General: No deformity. Normal range of motion.     Cervical back: Normal range of motion and neck supple.  Skin:    General: Skin is warm and dry.  Neurological:     General: No focal deficit present.     Mental Status: She is alert. Mental status is at baseline.     Cranial Nerves: No cranial nerve deficit.     Sensory: No sensory deficit.     ED Results / Procedures / Treatments   Labs (all labs ordered are listed, but only abnormal results are displayed) Labs Reviewed  URINALYSIS, ROUTINE W REFLEX MICROSCOPIC - Abnormal; Notable for the following components:      Result Value   Color, Urine STRAW (*)    Hgb urine dipstick SMALL (*)    Protein, ur 100 (*)    Bacteria, UA RARE (*)    All other components within normal limits  BASIC METABOLIC PANEL - Abnormal; Notable for the following components:   Glucose, Bld 104 (*)    Creatinine, Ser 1.01 (*)    GFR calc non Af Amer 53 (*)    All other components within normal limits  URINE CULTURE  CBC WITH DIFFERENTIAL/PLATELET    EKG EKG Interpretation  Date/Time:  Friday June 21 2020 20:46:58 EDT Ventricular Rate:  88 PR Interval:    QRS Duration: 95 QT Interval:  393 QTC  Calculation: 476 R Axis:   57 Text Interpretation: Sinus rhythm Baseline wander in lead(s) V6 Confirmed by Kristine Royal [phone]) on 06/21/2020 9:18:40 PM   Radiology No results found.  Procedures Procedures (including critical care time)  Medications Ordered in ED Medications  LORazepam (ATIVAN) injection 1 mg (has no administration in time range)  LORazepam (ATIVAN) injection 1 mg (1 mg Intramuscular Given by Other 06/21/20 2000)  haloperidol lactate (HALDOL) injection 2.5 mg (2.5 mg Intramuscular Given 06/21/20 2039)    ED Course  I have reviewed the triage vital signs  and the nursing notes.  Pertinent labs & imaging results that were available during my care of the patient were reviewed by me and considered in my medical decision making (see chart for details).    MDM Rules/Calculators/A&P                          MDM  Screen complete  Jaquanna Ballentine was evaluated in Emergency Department on 06/21/2020 for the symptoms described in the history of present illness. She was evaluated in the context of the global COVID-19 pandemic, which necessitated consideration that the patient might be at risk for infection with the SARS-CoV-2 virus that causes COVID-19. Institutional protocols and algorithms that pertain to the evaluation of patients at risk for COVID-19 are in a state of rapid change based on information released by regulatory bodies including the CDC and federal and state organizations. These policies and algorithms were followed during the patient's care in the ED.  Patient is presenting for evaluation of reported aggressive behavior with underlying history of dementia.   No evidence of acute medical pathology found on workup.   Patient did require medication /restraints while in the ED for agitation secondary to her dementia.   Following medication, patient was calmer and more directable.   [facility] to be contacted by Nursing Staff for possible discharge.   Dr.  Nicanor Alcon aware of pending discharge.     Final Clinical Impression(s) / ED Diagnoses Final diagnoses:  Dementia with behavioral disturbance, unspecified dementia type [facility])    Rx / DC Orders ED Discharge Orders    None       Wynetta Fines, MD 06/21/20 2247

## 2020-06-22 NOTE — ED Notes (Signed)
Pt removed own iv while swinging at staff.

## 2020-06-23 LAB — URINE CULTURE: Culture: NO GROWTH

## 2020-11-14 ENCOUNTER — Emergency Department (HOSPITAL_COMMUNITY): Payer: Medicare Other

## 2020-11-14 ENCOUNTER — Encounter (HOSPITAL_COMMUNITY): Payer: Self-pay | Admitting: Internal Medicine

## 2020-11-14 ENCOUNTER — Inpatient Hospital Stay (HOSPITAL_COMMUNITY)
Admission: EM | Admit: 2020-11-14 | Discharge: 2020-11-21 | DRG: 683 | Disposition: A | Discharge status: Skilled Nursing Facility | Payer: Medicare Other | Attending: Internal Medicine | Admitting: Internal Medicine

## 2020-11-14 ENCOUNTER — Inpatient Hospital Stay (HOSPITAL_COMMUNITY): Payer: Medicare Other

## 2020-11-14 DIAGNOSIS — M7989 Other specified soft tissue disorders: Secondary | ICD-10-CM | POA: Diagnosis not present

## 2020-11-14 DIAGNOSIS — Z20822 Contact with and (suspected) exposure to covid-19: Secondary | ICD-10-CM | POA: Diagnosis present

## 2020-11-14 DIAGNOSIS — R7989 Other specified abnormal findings of blood chemistry: Secondary | ICD-10-CM | POA: Diagnosis not present

## 2020-11-14 DIAGNOSIS — I878 Other specified disorders of veins: Secondary | ICD-10-CM | POA: Diagnosis present

## 2020-11-14 DIAGNOSIS — E860 Dehydration: Secondary | ICD-10-CM | POA: Diagnosis present

## 2020-11-14 DIAGNOSIS — R600 Localized edema: Secondary | ICD-10-CM | POA: Diagnosis not present

## 2020-11-14 DIAGNOSIS — F32A Depression, unspecified: Secondary | ICD-10-CM | POA: Diagnosis present

## 2020-11-14 DIAGNOSIS — I5031 Acute diastolic (congestive) heart failure: Secondary | ICD-10-CM | POA: Diagnosis not present

## 2020-11-14 DIAGNOSIS — N179 Acute kidney failure, unspecified: Principal | ICD-10-CM | POA: Diagnosis present

## 2020-11-14 DIAGNOSIS — R609 Edema, unspecified: Secondary | ICD-10-CM

## 2020-11-14 DIAGNOSIS — Z79899 Other long term (current) drug therapy: Secondary | ICD-10-CM | POA: Diagnosis not present

## 2020-11-14 DIAGNOSIS — I5032 Chronic diastolic (congestive) heart failure: Secondary | ICD-10-CM | POA: Diagnosis present

## 2020-11-14 DIAGNOSIS — Z888 Allergy status to other drugs, medicaments and biological substances status: Secondary | ICD-10-CM | POA: Diagnosis not present

## 2020-11-14 DIAGNOSIS — F0390 Unspecified dementia without behavioral disturbance: Secondary | ICD-10-CM | POA: Diagnosis present

## 2020-11-14 DIAGNOSIS — R627 Adult failure to thrive: Secondary | ICD-10-CM | POA: Diagnosis present

## 2020-11-14 DIAGNOSIS — Z66 Do not resuscitate: Secondary | ICD-10-CM | POA: Diagnosis present

## 2020-11-14 DIAGNOSIS — D638 Anemia in other chronic diseases classified elsewhere: Secondary | ICD-10-CM | POA: Diagnosis present

## 2020-11-14 DIAGNOSIS — I110 Hypertensive heart disease with heart failure: Secondary | ICD-10-CM | POA: Diagnosis present

## 2020-11-14 DIAGNOSIS — I517 Cardiomegaly: Secondary | ICD-10-CM

## 2020-11-14 DIAGNOSIS — Z8616 Personal history of COVID-19: Secondary | ICD-10-CM | POA: Diagnosis not present

## 2020-11-14 DIAGNOSIS — F419 Anxiety disorder, unspecified: Secondary | ICD-10-CM | POA: Diagnosis present

## 2020-11-14 DIAGNOSIS — D649 Anemia, unspecified: Secondary | ICD-10-CM | POA: Diagnosis present

## 2020-11-14 DIAGNOSIS — M1990 Unspecified osteoarthritis, unspecified site: Secondary | ICD-10-CM | POA: Diagnosis present

## 2020-11-14 DIAGNOSIS — U071 COVID-19: Secondary | ICD-10-CM | POA: Diagnosis not present

## 2020-11-14 DIAGNOSIS — F03C0 Unspecified dementia, severe, without behavioral disturbance, psychotic disturbance, mood disturbance, and anxiety: Secondary | ICD-10-CM | POA: Diagnosis present

## 2020-11-14 DIAGNOSIS — R001 Bradycardia, unspecified: Secondary | ICD-10-CM | POA: Diagnosis present

## 2020-11-14 LAB — CBC WITH DIFFERENTIAL/PLATELET
Abs Immature Granulocytes: 0.03 10*3/uL (ref 0.00–0.07)
Basophils Absolute: 0 10*3/uL (ref 0.0–0.1)
Basophils Relative: 1 %
Eosinophils Absolute: 0.1 10*3/uL (ref 0.0–0.5)
Eosinophils Relative: 2 %
HCT: 31.8 % — ABNORMAL LOW (ref 36.0–46.0)
Hemoglobin: 10.5 g/dL — ABNORMAL LOW (ref 12.0–15.0)
Immature Granulocytes: 1 %
Lymphocytes Relative: 25 %
Lymphs Abs: 1.4 10*3/uL (ref 0.7–4.0)
MCH: 32.4 pg (ref 26.0–34.0)
MCHC: 33 g/dL (ref 30.0–36.0)
MCV: 98.1 fL (ref 80.0–100.0)
Monocytes Absolute: 0.8 10*3/uL (ref 0.1–1.0)
Monocytes Relative: 14 %
Neutro Abs: 3.2 10*3/uL (ref 1.7–7.7)
Neutrophils Relative %: 57 %
Platelets: 181 10*3/uL (ref 150–400)
RBC: 3.24 MIL/uL — ABNORMAL LOW (ref 3.87–5.11)
RDW: 13.2 % (ref 11.5–15.5)
WBC: 5.5 10*3/uL (ref 4.0–10.5)
nRBC: 0 % (ref 0.0–0.2)

## 2020-11-14 LAB — COMPREHENSIVE METABOLIC PANEL
ALT: 13 U/L (ref 0–44)
AST: 28 U/L (ref 15–41)
Albumin: 3.2 g/dL — ABNORMAL LOW (ref 3.5–5.0)
Alkaline Phosphatase: 85 U/L (ref 38–126)
Anion gap: 12 (ref 5–15)
BUN: 54 mg/dL — ABNORMAL HIGH (ref 8–23)
CO2: 25 mmol/L (ref 22–32)
Calcium: 8.8 mg/dL — ABNORMAL LOW (ref 8.9–10.3)
Chloride: 104 mmol/L (ref 98–111)
Creatinine, Ser: 2.34 mg/dL — ABNORMAL HIGH (ref 0.44–1.00)
GFR, Estimated: 21 mL/min — ABNORMAL LOW (ref >60)
Glucose, Bld: 95 mg/dL (ref 70–99)
Potassium: 3.9 mmol/L (ref 3.5–5.1)
Sodium: 141 mmol/L (ref 135–145)
Total Bilirubin: 0.7 mg/dL (ref 0.3–1.2)
Total Protein: 6.1 g/dL — ABNORMAL LOW (ref 6.5–8.1)

## 2020-11-14 LAB — BRAIN NATRIURETIC PEPTIDE: B Natriuretic Peptide: 37.8 pg/mL (ref 0.0–100.0)

## 2020-11-14 MED ORDER — HEPARIN SODIUM (PORCINE) 5000 UNIT/ML IJ SOLN
5000.0000 [IU] | Freq: Three times a day (TID) | INTRAMUSCULAR | Status: DC
  Administered 2020-11-14 – 2020-11-21 (×20): 5000 [IU] via SUBCUTANEOUS
  Filled 2020-11-14 (×19): qty 1

## 2020-11-14 MED ORDER — HYDRALAZINE HCL 20 MG/ML IJ SOLN
5.0000 mg | INTRAMUSCULAR | Status: DC | PRN
  Administered 2020-11-14: 5 mg via INTRAVENOUS
  Filled 2020-11-14: qty 1

## 2020-11-14 MED ORDER — ACETAMINOPHEN 325 MG PO TABS
650.0000 mg | ORAL_TABLET | Freq: Four times a day (QID) | ORAL | Status: DC | PRN
  Administered 2020-11-15: 650 mg via ORAL
  Filled 2020-11-14: qty 2

## 2020-11-14 MED ORDER — SODIUM CHLORIDE 0.9 % IV SOLN: INTRAVENOUS | Status: AC

## 2020-11-14 MED ORDER — ACETAMINOPHEN 650 MG RE SUPP: 650.0000 mg | Freq: Four times a day (QID) | RECTAL | Status: DC | PRN

## 2020-11-14 NOTE — ED Provider Notes (Signed)
[facility] EMERGENCY DEPARTMENT Provider Note   CSN: [id number] Arrival date & time: 11/14/20  1441     History Chief Complaint  Patient presents with  . Leg Swelling    Angela Griffin is a 80 y.o. female presenting for evaluation of leg swelling.   Level V caveat due to dementia.   History provided by pt's daughter. She states pt has had leg swelling for a few weeks. Saw pcp 2 wks ago, thought to be 2/3 sedentary lifestyle and high sodium foods. Recommended exercise, leg elevation and compression socks. No lasix started at that time. Daughter states since then, pt has been dx with covid at her facility, completed 7 day quarantine. Facility repeated blood work today, showed worsened kidney fnx. Daughter denies fever, cough, sob, n/v, or known change in urination.   additional history obtained from chart review, reviewed recent pcp note. Negative cxr at that time. additional history of htn, diverticulosis, anxiety, dementia. Reviewed paperwork from facility, pt's SCr went from 0.9 (08/2020)  to 2.15 (11/13/2020).  HPI     Past Medical History:  Diagnosis Date  . Arthritis   . Diverticulitis   . Hypertension     Patient Active Problem List   Diagnosis Date Noted  . AKI (acute kidney injury) (HCC) 11/14/2020  . Low back pain radiating to both legs 02/05/2016  . 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 Right hip pain 10/09/2015  . Toe pain, right 09/02/2015  . Right leg pain 07/10/2015  . DDD (degenerative disc disease), lumbar 04/02/2015  . Pneumococcal vaccination declined 03/15/2015  . LBP (low back pain) 03/07/2015  . Change in blood platelet count 04/17/2014  . Arthritis, degenerative 04/17/2014  . Cannot sleep 04/17/2014  . Hemorrhoid 04/17/2014  . 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 Benign essential HTN 04/17/2014  . 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 Anxiety disorder 04/17/2014  . Abnormal thyroid stimulating hormone (TSH) level 04/17/2014  .  Thrombocytopenia (HCC) 04/17/2014    No past surgical history on file.   OB History   No obstetric history on file.     History reviewed. No pertinent family history.  Social History   Tobacco Use  . Smoking status: Never Smoker  Substance Use Topics  . Alcohol use: Yes    Alcohol/week: 0.0 standard drinks    Comment: occasional glass of wine  . Drug use: No    Home Medications Prior to Admission medications   Medication Sig Start Date End Date Taking? Authorizing Provider  cephALEXin (KEFLEX) 500 MG capsule Take 1 capsule (500 mg total) by mouth 3 (three) times daily. Patient not taking: Reported on 06/21/2020 04/09/20   Raeford Razor, MD  cephALEXin (KEFLEX) 500 MG capsule Take 1 capsule (500 mg total) by mouth 3 (three) times daily. 06/16/20   Cathren Laine, MD  diclofenac (VOLTAREN) 75 MG EC tablet Take 1 tablet (75 mg total) by mouth 2 (two) times daily. Patient not taking: Reported on 04/09/2020 10/10/15   Lenda Kelp, MD  donepezil (ARICEPT) 10 MG tablet Take 10 mg by mouth daily.    [provider]  escitalopram (LEXAPRO) 10 MG tablet Take 10 mg by mouth daily.    [provider]  gabapentin (NEURONTIN) 300 MG capsule Take 300 mg by mouth 3 (three) times daily.    [provider]  lisinopril (PRINIVIL,ZESTRIL) 40 MG tablet Take 20 mg by mouth in the morning and at bedtime.  06/17/15   [provider]  loratadine (CLARITIN) 10 MG tablet  Take 10 mg by mouth daily.    [provider]  memantine (NAMENDA) 5 MG tablet Take 5 mg by mouth 2 (two) times daily.     [provider]  nortriptyline (PAMELOR) 25 MG capsule Take 1 capsule (25 mg total) by mouth at bedtime. Patient not taking: Reported on 04/09/2020 10/10/15   Lenda Kelp, MD  risperiDONE (RISPERDAL) 0.25 MG tablet Take 0.25 mg by mouth 2 (two) times daily.     [provider]  traZODone (DESYREL) 100 MG tablet Take 100 mg by mouth at bedtime.   04/06/15   [provider]    Allergies    Prednisone  Review of Systems   Review of Systems  Unable to perform ROS: Dementia  Cardiovascular: Positive for leg swelling.    Physical Exam Updated Vital Signs BP 139/77 (BP Location: Left Arm)   Pulse (!) 57   Temp 98.4 F (36.9 C) (Oral)   Resp 15   SpO2 96%   Physical Exam Vitals and nursing note reviewed.  Constitutional:      General: She is not in acute distress.    Appearance: She is well-developed and well-nourished.     Comments: Resting in the chair in NAD  HENT:     Head: Normocephalic and atraumatic.  Eyes:     Extraocular Movements: Extraocular movements intact and EOM normal.     Conjunctiva/sclera: Conjunctivae normal.     Pupils: Pupils are equal, round, and reactive to light.  Cardiovascular:     Rate and Rhythm: Normal rate and regular rhythm.     Pulses: Normal pulses and intact distal pulses.  Pulmonary:     Effort: Pulmonary effort is normal. No respiratory distress.     Breath sounds: Normal breath sounds. No wheezing.     Comments: Clear lung sounds Abdominal:     General: There is no distension.     Palpations: Abdomen is soft. There is no mass.     Tenderness: There is no abdominal tenderness. There is no guarding or rebound.  Musculoskeletal:        General: Normal range of motion.     Cervical back: Normal range of motion and neck supple.     Right lower leg: Edema present.     Left lower leg: Edema present.     Comments: Bilateral lower ext edema, 1-2+ pitting edema. Equal bilaterally.   Skin:    General: Skin is warm and dry.     Capillary Refill: Capillary refill takes less than 2 seconds.  Neurological:     Mental Status: She is alert. Mental status is at baseline.     Comments: Oriented to person. Baseline mental status per daughter  Psychiatric:        Mood and Affect: Mood and affect normal.     ED Results / Procedures / Treatments   Labs (all labs ordered are  listed, but only abnormal results are displayed) Labs Reviewed  CBC WITH DIFFERENTIAL/PLATELET - Abnormal; Notable for the following components:      Result Value   RBC 3.24 (*)    Hemoglobin 10.5 (*)    HCT 31.8 (*)    All other components within normal limits  COMPREHENSIVE METABOLIC PANEL - Abnormal; Notable for the following components:   BUN 54 (*)    Creatinine, Ser 2.34 (*)    Calcium 8.8 (*)    Total Protein 6.1 (*)    Albumin 3.2 (*)    GFR, Estimated  21 (*)    All other components within normal limits  SARS CORONAVIRUS 2 (TAT 6-24 HRS)  BRAIN NATRIURETIC PEPTIDE  URINALYSIS, ROUTINE W REFLEX MICROSCOPIC    EKG None  Radiology DG Chest Portable 1 View  Result Date: 11/14/2020 CLINICAL DATA:  Edema EXAM: PORTABLE CHEST 1 VIEW COMPARISON:  Portable exam 1613 hours compared to 10/29/2020 FINDINGS: Enlargement of cardiac silhouette. Mediastinal contours and pulmonary vascularity normal. Atherosclerotic calcification aorta. Lungs clear. No pulmonary infiltrate, pleural effusion, or pneumothorax. Osseous structures unremarkable. IMPRESSION: Enlargement of cardiac silhouette without acute abnormalities. Aortic Atherosclerosis (ICD10-I70.0). Electronically Signed   By: Ulyses Southward M.D.   On: 11/14/2020 16:31    Procedures Procedures (including critical care time)  Medications Ordered in ED Medications - No data to display  ED Course  I have reviewed the triage vital signs and the nursing notes.  Pertinent labs & imaging results that were available during my care of the patient were reviewed by me and considered in my medical decision making (see chart for details).    MDM Rules/Calculators/A&P                          Pt presenting for evaluation of leg swelling. On exam, pt appears nontoxic. Swelling in equal bilaterally,m doubt dvt. Of note, BP is low, in the 90's. In the setting of elevated SCr, concern for dehydration. However due to peripheral edema and concern for  possible heart failure, will hold on fluid bolus and pt is mentating well. Will continue to monitor. Will order labs including BNP, CXR, UA.   Chest x-ray interpreted to me, shows cardiomegaly, but no obvious pleural effusion.  Labs consistent with AKI.  In the setting of hypotension, AKI, and edema, patient will need to be admitted for further management and stabilization.  Discussed with patient and daughter, who are agreeable.   Discussed with Dr. Loney Loh from [facility], pt to be admitted.   Final Clinical Impression(s) / ED Diagnoses Final diagnoses:  AKI (acute kidney injury) [facility])  Cardiomegaly  Peripheral edema    Rx / DC Orders ED Discharge Orders    None       Alveria Apley, PA-C 11/14/20 1944    Benjiman Core, MD 11/21/20 [phone]

## 2020-11-14 NOTE — H&P (Signed)
History and Physical    Angela Griffin:[id number] DOB: Jan 21, 1941 DOA: 11/14/2020  PCP: Patient, No Pcp Per Patient coming from: ALF  Chief Complaint: Bilateral lower extremity edema  HPI: Angela Griffin is a 80 y.o. female with medical history significant of advanced dementia, arthritis, diverticulitis, hypertension presenting to the ED via EMS for evaluation of bilateral lower extremity edema.  Facility reported to EMS that patient is oriented to self only at baseline.  Patient was seen at [facility] urgent care on 10/29/2020 for bilateral lower extremity edema and significant weight gain over the past year.  Labs reveal normal BNP.  Creatinine was 1.3.  TSH within normal range.  Chest x-ray did not show active cardiopulmonary disease.  Her lower extremity edema was felt to be due to sedentary lifestyle and consumption of high sodium foods.  Recommendation was to do passive elevation of lower extremities at rest, wear compression stockings, and modify dietary sodium intake.   History provided by patient's daughter at bedside who states patient started having swelling in her legs around Christmas time which her doctor felt was due to sedentary lifestyle and eating high sodium foods during the holidays.  States patient was not treated with any medications for her edema as she also had COVID recently.  Per daughter, patient tested positive for COVID on 11/01/2020 and was put in quarantine for 10 days.  Daughter states patient was previously having shortness of breath which has now resolved.  She has not had any vomiting or diarrhea.  States the facility sent the patient to the hospital today for evaluation of worsening kidney function and swelling in her legs.  Per triage note, facility reported patient had a negative COVID test yesterday.  ED Course: Afebrile.  Bradycardic with heart rate in the 50s.  Initial blood pressure 91/52 on arrival could possibly be a false reading as subsequent blood  pressure readings improved without patient receiving any IV fluids.  Not tachypneic or hypoxic.  WBC 5.5, hemoglobin 10.5 (baseline 12-13), hematocrit 31.8, MCV 98.1, platelet count 181K.  Sodium 141, potassium 3.9, chloride 104, bicarb 25, BUN 54, creatinine 2.3 (baseline 0.8-0.9), glucose 95.  LFTs normal.  UA pending.  BNP within normal range.  SARS-CoV-2 PCR test pending.  Chest x-ray showing enlargement of cardiac silhouette, aortic atherosclerosis, and no acute abnormalities.  Review of Systems:  All systems reviewed and apart from history of presenting illness, are negative.  Past Medical History:  Diagnosis Date  . Arthritis   . Diverticulitis   . Hypertension     No past surgical history on file.   reports that she has never smoked. She does not have any smokeless tobacco history on file. She reports current alcohol use. She reports that she does not use drugs.  Allergies  Allergen Reactions  . Prednisone Swelling    Swelling and blisters in her tongue    History reviewed. No pertinent family history.  Prior to Admission medications   Medication Sig Start Date End Date Taking? Authorizing Provider  cephALEXin (KEFLEX) 500 MG capsule Take 1 capsule (500 mg total) by mouth 3 (three) times daily. Patient not taking: Reported on 06/21/2020 04/09/20   Raeford Razor, MD  cephALEXin (KEFLEX) 500 MG capsule Take 1 capsule (500 mg total) by mouth 3 (three) times daily. 06/16/20   Cathren Laine, MD  diclofenac (VOLTAREN) 75 MG EC tablet Take 1 tablet (75 mg total) by mouth 2 (two) times daily. Patient not taking: Reported on 04/09/2020 10/10/15  Hudnall, Azucena Fallen, MD  donepezil (ARICEPT) 10 MG tablet Take 10 mg by mouth daily.    [provider]  escitalopram (LEXAPRO) 10 MG tablet Take 10 mg by mouth daily.    [provider]  gabapentin (NEURONTIN) 300 MG capsule Take 300 mg by mouth 3 (three) times daily.    [provider]  lisinopril (PRINIVIL,ZESTRIL) 40  MG tablet Take 20 mg by mouth in the morning and at bedtime.  06/17/15   [provider]  loratadine (CLARITIN) 10 MG tablet Take 10 mg by mouth daily.    [provider]  memantine (NAMENDA) 5 MG tablet Take 5 mg by mouth 2 (two) times daily.     [provider]  nortriptyline (PAMELOR) 25 MG capsule Take 1 capsule (25 mg total) by mouth at bedtime. Patient not taking: Reported on 04/09/2020 10/10/15   Lenda Kelp, MD  risperiDONE (RISPERDAL) 0.25 MG tablet Take 0.25 mg by mouth 2 (two) times daily.     [provider]  traZODone (DESYREL) 100 MG tablet Take 100 mg by mouth at bedtime.  04/06/15   [provider]    Physical Exam: Vitals:   11/14/20 1458 11/14/20 1749 11/14/20 1940  BP: (!) 91/52 139/77 (!) 171/75  Pulse: (!) 53 (!) 57 60  Resp: 14 15 16   Temp: 98.4 F (36.9 C)    TempSrc: Oral    SpO2: 96% 96% 97%    Physical Exam Constitutional:      General: She is not in acute distress. HENT:     Head: Normocephalic and atraumatic.  Eyes:     Extraocular Movements: Extraocular movements intact.     Conjunctiva/sclera: Conjunctivae normal.  Cardiovascular:     Rate and Rhythm: Normal rate and regular rhythm.     Pulses: Normal pulses.  Pulmonary:     Effort: Pulmonary effort is normal. No respiratory distress.     Breath sounds: Normal breath sounds. No wheezing or rales.  Abdominal:     General: Bowel sounds are normal. There is no distension.     Palpations: Abdomen is soft.     Tenderness: There is no abdominal tenderness.  Musculoskeletal:     Cervical back: Normal range of motion and neck supple.     Right lower leg: Edema present.     Left lower leg: Edema present.     Comments: +4 pedal edema bilaterally  Skin:    General: Skin is warm and dry.  Neurological:     General: No focal deficit present.     Mental Status: She is alert. Mental status is at baseline.     Labs on Admission: I have personally reviewed  following labs and imaging studies  CBC: Recent Labs  Lab 11/14/20 1700  WBC 5.5  NEUTROABS 3.2  HGB 10.5*  HCT 31.8*  MCV 98.1  PLT 181   Basic Metabolic Panel: Recent Labs  Lab 11/14/20 1549  NA 141  K 3.9  CL 104  CO2 25  GLUCOSE 95  BUN 54*  CREATININE 2.34*  CALCIUM 8.8*   GFR: CrCl cannot be calculated (Unknown ideal weight.). Liver Function Tests: Recent Labs  Lab 11/14/20 1549  AST 28  ALT 13  ALKPHOS 85  BILITOT 0.7  PROT 6.1*  ALBUMIN 3.2*   No results for input(s): LIPASE, AMYLASE in the last 168 hours. No results for input(s): AMMONIA in the last 168 hours. Coagulation Profile: No results for input(s): INR, PROTIME in the  last 168 hours. Cardiac Enzymes: No results for input(s): CKTOTAL, CKMB, CKMBINDEX, TROPONINI in the last 168 hours. BNP (last 3 results) No results for input(s): PROBNP in the last 8760 hours. HbA1C: No results for input(s): HGBA1C in the last 72 hours. CBG: No results for input(s): GLUCAP in the last 168 hours. Lipid Profile: No results for input(s): CHOL, HDL, LDLCALC, TRIG, CHOLHDL, LDLDIRECT in the last 72 hours. Thyroid Function Tests: No results for input(s): TSH, T4TOTAL, FREET4, T3FREE, THYROIDAB in the last 72 hours. Anemia Panel: No results for input(s): VITAMINB12, FOLATE, FERRITIN, TIBC, IRON, RETICCTPCT in the last 72 hours. Urine analysis:    Component Value Date/Time   COLORURINE STRAW (A) 06/21/2020 2006   APPEARANCEUR CLEAR 06/21/2020 2006   LABSPEC 1.015 06/21/2020 2006   PHURINE 6.0 06/21/2020 2006   GLUCOSEU NEGATIVE 06/21/2020 2006   HGBUR SMALL (A) 06/21/2020 2006   BILIRUBINUR NEGATIVE 06/21/2020 2006   KETONESUR NEGATIVE 06/21/2020 2006   PROTEINUR 100 (A) 06/21/2020 2006   UROBILINOGEN 0.2 05/05/2014 1344   NITRITE NEGATIVE 06/21/2020 2006   LEUKOCYTESUR NEGATIVE 06/21/2020 2006    Radiological Exams on Admission: DG Chest Portable 1 View  Result Date: 11/14/2020 CLINICAL DATA:  Edema  EXAM: PORTABLE CHEST 1 VIEW COMPARISON:  Portable exam 1613 hours compared to 10/29/2020 FINDINGS: Enlargement of cardiac silhouette. Mediastinal contours and pulmonary vascularity normal. Atherosclerotic calcification aorta. Lungs clear. No pulmonary infiltrate, pleural effusion, or pneumothorax. Osseous structures unremarkable. IMPRESSION: Enlargement of cardiac silhouette without acute abnormalities. Aortic Atherosclerosis (ICD10-I70.0). Electronically Signed   By: Ulyses Southward M.D.   On: 11/14/2020 16:31    EKG: Independently reviewed.  Sinus bradycardia.  Assessment/Plan Principal Problem:   AKI (acute kidney injury) (HCC) Active Problems:   Bilateral lower extremity edema   Sinus bradycardia   Advanced dementia (HCC)   Anemia   AKI: BUN 54, creatinine 2.3 (baseline 0.8-0.9).  Creatinine was 1.3 during recent urgent care visit on 10/29/2020.  Patient is not on any nephrotoxic medications at home.  Etiology of AKI unclear, ?prerenal from dehydration in setting of recent viral illness and home lisinopril use. -Give gentle IV fluid hydration.  Monitor renal function and urine output.  Check urine sodium and creatinine.  Order renal ultrasound.  Avoid nephrotoxic agents, hold lisinopril.  Bilateral lower extremity edema: Likely due to venous stasis.  BNP within normal range.  Chest x-ray not suggestive of pulmonary edema or pleural effusion.  Recently checked TSH was normal.   -Elevate lower extremities at rest.  Will hold Lasix at this time given AKI.  Sinus bradycardia: Bradycardic with heart rate in the 40s to 50s at present.  Recently checked TSH was normal.  Patient is not on any AV nodal blocking agents.  Not hypotensive, blood pressure elevated at present. -Cardiac monitoring  Recent COVID-19 viral infection: Per patient's daughter, patient tested positive for COVID on 11/01/2020 and was in isolation for 10 days.  Per triage note, facility reported that patient tested negative for COVID  yesterday.  No respiratory symptoms at present.  Not hypoxic and chest x-ray without evidence of pneumonia. -Repeat SARS-CoV-2 PCR test ordered in the ED pending  Advanced dementia -Resume home meds after pharmacy med rec is done.  Depression/mood disorder -Resume home meds after pharmacy med rec is done.  Hypertension: Initial blood pressure 91/52 upon arrival to the ED could be a false reading as blood pressure now elevated without patient receiving any IV fluids. -Hold lisinopril given AKI.  IV hydralazine PRN SBP >180.  Normocytic anemia: Hemoglobin 10.5, MCV 98.1.  Baseline hemoglobin 12-13.  No symptoms of GI bleed reported by the facility. -Anemia panel  DVT prophylaxis: Subcutaneous heparin Code Status: DNR-discussed with daughter at bedside. Family Communication: Daughter updated. Disposition Plan: Status is: Inpatient  Remains inpatient appropriate because:Inpatient level of care appropriate due to severity of illness and AKI   Dispo: The patient is from: ALF              Anticipated d/c is to: ALF              Anticipated d/c date is: 3 days              Patient currently is not medically stable to d/c.  The medical decision making on this patient was of high complexity and the patient is at high risk for clinical deterioration, therefore this is a level 3 visit.  John GiovanniVasundhra Daytona Hedman MD [facility]  If 7PM-7AM, please contact night-coverage [email or web address]  11/14/2020, 8:08 PM

## 2020-11-14 NOTE — ED Triage Notes (Signed)
Patient BIB [facility] for BIL leg edema x2 days. Facility reports history of dementia, oriented to self at baseline, negative for COVID (tested yesterday).   EMS Vitals 110/52 HR 58 SpO2 93% on room air

## 2020-11-14 NOTE — ED Notes (Signed)
Pt continuously  keeps pulling off cardiac leads and ED equipment    2130 Pt refused [place] will notified MD

## 2020-11-15 ENCOUNTER — Encounter (HOSPITAL_COMMUNITY): Payer: Medicare Other

## 2020-11-15 ENCOUNTER — Inpatient Hospital Stay (HOSPITAL_COMMUNITY): Payer: Medicare Other

## 2020-11-15 DIAGNOSIS — I517 Cardiomegaly: Secondary | ICD-10-CM

## 2020-11-15 DIAGNOSIS — N179 Acute kidney failure, unspecified: Secondary | ICD-10-CM | POA: Diagnosis not present

## 2020-11-15 DIAGNOSIS — F0390 Unspecified dementia without behavioral disturbance: Secondary | ICD-10-CM | POA: Diagnosis not present

## 2020-11-15 DIAGNOSIS — I5031 Acute diastolic (congestive) heart failure: Secondary | ICD-10-CM

## 2020-11-15 DIAGNOSIS — R600 Localized edema: Secondary | ICD-10-CM

## 2020-11-15 LAB — ECHOCARDIOGRAM COMPLETE
Area-P 1/2: 2.69 cm2
S' Lateral: 2.3 cm

## 2020-11-15 LAB — COMPREHENSIVE METABOLIC PANEL
ALT: 16 U/L (ref 0–44)
AST: 26 U/L (ref 15–41)
Albumin: 3.4 g/dL — ABNORMAL LOW (ref 3.5–5.0)
Alkaline Phosphatase: 94 U/L (ref 38–126)
Anion gap: 13 (ref 5–15)
BUN: 35 mg/dL — ABNORMAL HIGH (ref 8–23)
CO2: 27 mmol/L (ref 22–32)
Calcium: 9.5 mg/dL (ref 8.9–10.3)
Chloride: 102 mmol/L (ref 98–111)
Creatinine, Ser: 1.31 mg/dL — ABNORMAL HIGH (ref 0.44–1.00)
GFR, Estimated: 41 mL/min — ABNORMAL LOW (ref >60)
Glucose, Bld: 108 mg/dL — ABNORMAL HIGH (ref 70–99)
Potassium: 3.5 mmol/L (ref 3.5–5.1)
Sodium: 142 mmol/L (ref 135–145)
Total Bilirubin: 0.6 mg/dL (ref 0.3–1.2)
Total Protein: 6.7 g/dL (ref 6.5–8.1)

## 2020-11-15 LAB — RETICULOCYTES
Immature Retic Fract: 4.6 % (ref 2.3–15.9)
RBC.: 3.89 MIL/uL (ref 3.87–5.11)
Retic Count, Absolute: 33.1 10*3/uL (ref 19.0–186.0)
Retic Ct Pct: 0.9 % (ref 0.4–3.1)

## 2020-11-15 LAB — CBC
HCT: 35.6 % — ABNORMAL LOW (ref 36.0–46.0)
Hemoglobin: 12 g/dL (ref 12.0–15.0)
MCH: 31.6 pg (ref 26.0–34.0)
MCHC: 33.7 g/dL (ref 30.0–36.0)
MCV: 93.7 fL (ref 80.0–100.0)
Platelets: 209 10*3/uL (ref 150–400)
RBC: 3.8 MIL/uL — ABNORMAL LOW (ref 3.87–5.11)
RDW: 13 % (ref 11.5–15.5)
WBC: 10.1 10*3/uL (ref 4.0–10.5)
nRBC: 0 % (ref 0.0–0.2)

## 2020-11-15 LAB — FOLATE: Folate: 16.6 ng/mL (ref >5.9)

## 2020-11-15 LAB — IRON AND TIBC
Iron: 75 ug/dL (ref 28–170)
Saturation Ratios: 22 % (ref 10.4–31.8)
TIBC: 343 ug/dL (ref 250–450)
UIBC: 268 ug/dL

## 2020-11-15 LAB — FERRITIN: Ferritin: 248 ng/mL (ref 11–307)

## 2020-11-15 LAB — SARS CORONAVIRUS 2 (TAT 6-24 HRS): SARS Coronavirus 2: POSITIVE — AB

## 2020-11-15 LAB — VITAMIN B12: Vitamin B-12: 513 pg/mL (ref 180–914)

## 2020-11-15 LAB — C-REACTIVE PROTEIN: CRP: 0.7 mg/dL (ref <1.0)

## 2020-11-15 LAB — TSH: TSH: 0.595 u[IU]/mL (ref 0.350–4.500)

## 2020-11-15 LAB — D-DIMER, QUANTITATIVE: D-Dimer, Quant: 2.62 ug/mL-FEU — ABNORMAL HIGH (ref 0.00–0.50)

## 2020-11-15 MED ORDER — METOPROLOL TARTRATE 25 MG PO TABS
25.0000 mg | ORAL_TABLET | Freq: Two times a day (BID) | ORAL | Status: DC
  Administered 2020-11-15: 25 mg via ORAL
  Filled 2020-11-15: qty 1

## 2020-11-15 MED ORDER — HALOPERIDOL LACTATE 5 MG/ML IJ SOLN: 2.0000 mg | Freq: Once | INTRAMUSCULAR | Status: DC

## 2020-11-15 MED ORDER — HYDRALAZINE HCL 20 MG/ML IJ SOLN
10.0000 mg | Freq: Four times a day (QID) | INTRAMUSCULAR | Status: DC | PRN
  Administered 2020-11-15 – 2020-11-20 (×2): 10 mg via INTRAVENOUS
  Filled 2020-11-15 (×2): qty 1

## 2020-11-15 NOTE — Progress Notes (Signed)
[facility]                                                                              Patient Demographics  Angela Griffin, is a 80 y.o. female, DOB - 11/25/1940, NOI:[id number]  Admit date - 11/14/2020   Admitting Physician John Giovanni, MD  Outpatient Primary MD for the patient is Patient, No Pcp Per  Outpatient specialists:   LOS - 1  days   Medical records reviewed and are as summarized below:    Chief Complaint  Patient presents with  . Leg Swelling       Brief summary   Patient is a 80 year old female with history of advanced dementia, arthritis, diverticulitis, hypertension presented to ED with bilateral lower extremity edema. Patient was seen at [facility] urgent care on 10/29/2020 for bilateral lower extremity edema and weight gain over the past year. Normal BNP, creatinine was 1.3. TSH normal. Lower extremity edema spent due to sedentary lifestyle and consumption of high sodium foods. Patient's daughter reported that she started having swelling in her legs around Christmas time, was not treated with any medication for the edema as she also had COVID recently. Patient had tested positive for COVID on 11/01/2020 and was put in isolation for 10 days. Per daughter, she was having shortness of breath previously which has now resolved, no vomiting or diarrhea. Patient was sent by the facility to the hospital for evaluation of worsening kidney function and swelling in the legs.  In ED, afebrile, bradycardia with heart rate in 50s, BP 91/52, hemoglobin 10.5, creatinine 2.3, baseline 0.8-0.9. COVID-19 test on 11/14/2020 positive   Assessment & Plan    Principal Problem:   AKI (acute kidney injury) (HCC) -Baseline creatinine 0.8-0.9, presented with creatinine of 2.3, was 1.3 during recent urgent care visit on 10/29/2020 -Unclear etiology, possibly secondary to dehydration in the setting of recent viral illness, lisinopril, HCTZ -Lisinopril, HCTZ held,  patient received gentle IV fluid hydration in ED, currently KVO -Creatinine improved to 1.3  Active Problems:   Bilateral lower extremity edema -Chest x-ray negative for pulmonary edema, TSH 0.5 -Lasix was held given AKI. BNP -Follow 2D echo -Inflammatory markers showed CRP 0.7, ferritin 248, D-dimer 2.6 -Obtain venous Doppler ultrasound to rule out any underlying DVT  Hypertension -BP mildly elevated, holding ACE inhibitor, HCTZ due to AKI - will start on low-dose Lopressor for elevated BP however given overnight asymptomatic bradycardia will hold off beta-blocker as well. Heart rate 76 -Continue IV hydralazine as needed with parameters  Recent COVID-19 viral infection -Repeat COVID-19 test positive, currently no hypoxia. Chest x-ray without evidence of pneumonia -Inflammatory markers showed normal CRP however D-dimer is elevated 2.6 -Obtain Doppler ultrasound of the lower extremity to rule out DVT  Advanced dementia, depression Resume home meds  Normocytic anemia -Anemia panel showed ferritin 48, folate 16.6, normal B12 513, likely anemia of chronic disease -No acute complaints, no active bleeding  Estimated body mass index is 22.13 kg/m as calculated from the following:   Height as of 02/04/16: 5\' 2"  (1.575 m).   Weight as of 02/04/16: 54.9 kg.  Code Status: DNR DVT Prophylaxis:  Heparin subcu Family Communication: Discussed all imaging results, lab results, explained to the patient  Disposition Plan:     Status is: Inpatient  Remains inpatient appropriate because:Inpatient level of care appropriate due to severity of illness   Dispo: The patient is from: Home              Anticipated d/c is to: Home              Anticipated d/c date is: 2 days              Patient currently is not medically stable to d/c.      Time Spent in minutes   35 minutes  Procedures:  2D echo Consultants:   None  Antimicrobials:   Anti-infectives (From admission, onward)   None           Medications  Scheduled Meds: . haloperidol lactate  2 mg Intravenous Once  . heparin  5,000 Units Subcutaneous Q8H  . metoprolol tartrate  25 mg Oral BID   Continuous Infusions: PRN Meds:.acetaminophen **OR** acetaminophen, hydrALAZINE      Subjective:   Angela Griffin was seen and examined today. No acute complaints, no chest pain or acute shortness of breath, abdominal pain or fevers. Has advanced dementia, difficult to obtain review of system from the patient  Objective:   Vitals:   11/14/20 2250 11/15/20 0049 11/15/20 0123 11/15/20 0602  BP: (!) 180/89 (!) 182/89 (!) 181/83 (!) 137/108  Pulse: 61 63 62 76  Resp: 15 16 20 19   Temp:  98.7 F (37.1 C) 98.5 F (36.9 C) 99.1 F (37.3 C)  TempSrc:  Oral Oral Oral  SpO2: 96% 93% 97% 97%   No intake or output data in the 24 hours ending 11/15/20 1053   Wt Readings from Last 3 Encounters:  02/04/16 54.9 kg  10/23/15 54.4 kg  10/07/15 53.5 kg     Exam  General: Alert and oriented x self, NAD  Cardiovascular: S1 S2 auscultated, no murmurs, RRR  Respiratory: Clear to auscultation bilaterally,  Gastrointestinal: Soft, nontender, nondistended, + bowel sounds  Ext: 1+ pedal edema bilaterally  Neuro: no new deficits  Musculoskeletal: No digital cyanosis, clubbing  Skin: No rashes  Psych: has dementia   Data Reviewed:  I have personally reviewed following labs and imaging studies  Micro Results Recent Results (from the past 240 hour(s))  SARS CORONAVIRUS 2 (TAT 6-24 HRS) Nasopharyngeal Nasopharyngeal Swab     Status: Abnormal   Collection Time: 11/14/20  7:18 PM   Specimen: Nasopharyngeal Swab  Result Value Ref Range Status   SARS Coronavirus 2 POSITIVE (A) NEGATIVE Final    Comment: (NOTE) SARS-CoV-2 target nucleic acids are DETECTED.  The SARS-CoV-2 RNA is generally detectable in upper and lower respiratory specimens during the acute phase of infection. Positive results are indicative of the  presence of SARS-CoV-2 RNA. Clinical correlation with patient history and other diagnostic information is  necessary to determine patient infection status. Positive results do not rule out bacterial infection or co-infection with other viruses.  The expected result is Negative.  Fact Sheet for Patients: 11/16/20  Fact Sheet for Healthcare Providers: HairSlick.no  This test is not yet approved or cleared by the [employer]ir[employer] FDA and  has been authorized for detection and/or diagnosis of SARS-CoV-2 by FDA under an Emergency Use Authorization (EUA). This EUA will remain  in effect (meaning this test can be used) for the duration of the COVID-19 declaration under Section 564(b)(1) of  the Act, 21 U. S.C. section 360bbb-3(b)(1), unless the authorization is terminated or revoked sooner.   Performed at [facility], [address] [place], [place] [address]     Radiology Reports DG Chest Portable 1 View  Result Date: 11/14/2020 CLINICAL DATA:  Edema EXAM: PORTABLE CHEST 1 VIEW COMPARISON:  Portable exam 1613 hours compared to 10/29/2020 FINDINGS: Enlargement of cardiac silhouette. Mediastinal contours and pulmonary vascularity normal. Atherosclerotic calcification aorta. Lungs clear. No pulmonary infiltrate, pleural effusion, or pneumothorax. Osseous structures unremarkable. IMPRESSION: Enlargement of cardiac silhouette without acute abnormalities. Aortic Atherosclerosis (ICD10-I70.0). Electronically Signed   By: Ulyses Southward M.D.   On: 11/14/2020 16:31    Lab Data:  CBC: Recent Labs  Lab 11/14/20 1700 11/15/20 0728  WBC 5.5 10.1  NEUTROABS 3.2  --   HGB 10.5* 12.0  HCT 31.8* 35.6*  MCV 98.1 93.7  PLT 181 209   Basic Metabolic Panel: Recent Labs  Lab 11/14/20 1549 11/15/20 0728  NA 141 142  K 3.9 3.5  CL 104 102  CO2 25 27  GLUCOSE 95 108*  BUN 54* 35*  CREATININE 2.34* 1.31*  CALCIUM 8.8* 9.5    GFR: CrCl cannot be calculated (Unknown ideal weight.). Liver Function Tests: Recent Labs  Lab 11/14/20 1549 11/15/20 0728  AST 28 26  ALT 13 16  ALKPHOS 85 94  BILITOT 0.7 0.6  PROT 6.1* 6.7  ALBUMIN 3.2* 3.4*   No results for input(s): LIPASE, AMYLASE in the last 168 hours. No results for input(s): AMMONIA in the last 168 hours. Coagulation Profile: No results for input(s): INR, PROTIME in the last 168 hours. Cardiac Enzymes: No results for input(s): CKTOTAL, CKMB, CKMBINDEX, TROPONINI in the last 168 hours. BNP (last 3 results) No results for input(s): PROBNP in the last 8760 hours. HbA1C: No results for input(s): HGBA1C in the last 72 hours. CBG: No results for input(s): GLUCAP in the last 168 hours. Lipid Profile: No results for input(s): CHOL, HDL, LDLCALC, TRIG, CHOLHDL, LDLDIRECT in the last 72 hours. Thyroid Function Tests: Recent Labs    11/15/20 0728  TSH 0.595   Anemia Panel: Recent Labs    11/15/20 0156  VITAMINB12 513  FOLATE 16.6  FERRITIN 248  TIBC 343  IRON 75  RETICCTPCT 0.9   Urine analysis:    Component Value Date/Time   COLORURINE STRAW (A) 06/21/2020 2006   APPEARANCEUR CLEAR 06/21/2020 2006   LABSPEC 1.015 06/21/2020 2006   PHURINE 6.0 06/21/2020 2006   GLUCOSEU NEGATIVE 06/21/2020 2006   HGBUR SMALL (A) 06/21/2020 2006   BILIRUBINUR NEGATIVE 06/21/2020 2006   KETONESUR NEGATIVE 06/21/2020 2006   PROTEINUR 100 (A) 06/21/2020 2006   UROBILINOGEN 0.2 05/05/2014 1344   NITRITE NEGATIVE 06/21/2020 2006   LEUKOCYTESUR NEGATIVE 06/21/2020 2006     Angela Griffin M.D. [facility] 11/15/2020, 10:53 AM   Call night coverage person covering after 7pm

## 2020-11-15 NOTE — Progress Notes (Signed)
1045 The patient is refusing the bilateral lower extremity duplex. The patient states that she does not want the testing. This tech explained the importance of having the test and the patient continues to refuse.    11/15/20 10:44 AM Olen Cordial RVT

## 2020-11-15 NOTE — Progress Notes (Signed)
  Echocardiogram 2D Echocardiogram has been performed.  Delcie Roch 11/15/2020, 9:54 AM

## 2020-11-16 DIAGNOSIS — N179 Acute kidney failure, unspecified: Secondary | ICD-10-CM | POA: Diagnosis not present

## 2020-11-16 LAB — BASIC METABOLIC PANEL
Anion gap: 16 — ABNORMAL HIGH (ref 5–15)
BUN: 28 mg/dL — ABNORMAL HIGH (ref 8–23)
CO2: 20 mmol/L — ABNORMAL LOW (ref 22–32)
Calcium: 9.7 mg/dL (ref 8.9–10.3)
Chloride: 102 mmol/L (ref 98–111)
Creatinine, Ser: 1.3 mg/dL — ABNORMAL HIGH (ref 0.44–1.00)
GFR, Estimated: 42 mL/min — ABNORMAL LOW (ref >60)
Glucose, Bld: 129 mg/dL — ABNORMAL HIGH (ref 70–99)
Potassium: 3.7 mmol/L (ref 3.5–5.1)
Sodium: 138 mmol/L (ref 135–145)

## 2020-11-16 LAB — CBC
HCT: 37.5 % (ref 36.0–46.0)
Hemoglobin: 12.1 g/dL (ref 12.0–15.0)
MCH: 30.4 pg (ref 26.0–34.0)
MCHC: 32.3 g/dL (ref 30.0–36.0)
MCV: 94.2 fL (ref 80.0–100.0)
Platelets: 227 10*3/uL (ref 150–400)
RBC: 3.98 MIL/uL (ref 3.87–5.11)
RDW: 13.1 % (ref 11.5–15.5)
WBC: 9.2 10*3/uL (ref 4.0–10.5)
nRBC: 0 % (ref 0.0–0.2)

## 2020-11-16 LAB — D-DIMER, QUANTITATIVE: D-Dimer, Quant: 2.94 ug/mL-FEU — ABNORMAL HIGH (ref 0.00–0.50)

## 2020-11-16 MED ORDER — LORATADINE 10 MG PO TABS
10.0000 mg | ORAL_TABLET | Freq: Every day | ORAL | Status: DC
  Administered 2020-11-16 – 2020-11-21 (×6): 10 mg via ORAL
  Filled 2020-11-16 (×6): qty 1

## 2020-11-16 MED ORDER — ESCITALOPRAM OXALATE 10 MG PO TABS
10.0000 mg | ORAL_TABLET | Freq: Every day | ORAL | Status: DC
  Administered 2020-11-16 – 2020-11-21 (×6): 10 mg via ORAL
  Filled 2020-11-16 (×6): qty 1

## 2020-11-16 MED ORDER — DONEPEZIL HCL 10 MG PO TABS
10.0000 mg | ORAL_TABLET | Freq: Every day | ORAL | Status: DC
  Administered 2020-11-16 – 2020-11-21 (×6): 10 mg via ORAL
  Filled 2020-11-16 (×6): qty 1

## 2020-11-16 MED ORDER — RISPERIDONE 0.5 MG PO TABS
0.5000 mg | ORAL_TABLET | Freq: Two times a day (BID) | ORAL | Status: DC
  Administered 2020-11-16 – 2020-11-21 (×10): 0.5 mg via ORAL
  Filled 2020-11-16 (×10): qty 1

## 2020-11-16 MED ORDER — MEMANTINE HCL 10 MG PO TABS
10.0000 mg | ORAL_TABLET | Freq: Two times a day (BID) | ORAL | Status: DC
Start: 2020-11-16 — End: 2020-11-21
  Administered 2020-11-16 – 2020-11-21 (×10): 10 mg via ORAL
  Filled 2020-11-16 (×10): qty 1

## 2020-11-16 MED ORDER — DIVALPROEX SODIUM 125 MG PO CSDR
125.0000 mg | DELAYED_RELEASE_CAPSULE | Freq: Three times a day (TID) | ORAL | Status: DC
  Administered 2020-11-16 – 2020-11-21 (×14): 125 mg via ORAL
  Filled 2020-11-16 (×15): qty 1

## 2020-11-16 NOTE — Progress Notes (Signed)
[facility]                                                                              Patient Demographics  Angela Griffin, is a 80 y.o. female, DOB - Aug 03, 1941, EXH:[id number]  Admit date - 11/14/2020   Admitting Physician John Giovanni, MD  Outpatient Primary MD for the patient is Patient, No Pcp Per  Outpatient specialists:   LOS - 2  days   Medical records reviewed and are as summarized below:   Chief Complaint  Patient presents with  . Leg Swelling       Brief summary   Patient is a 80 year old female with history of advanced dementia, arthritis, diverticulitis, hypertension presented to ED with bilateral lower extremity edema. Patient was seen at [facility] urgent care on 10/29/2020 for bilateral lower extremity edema and weight gain over the past year. Normal BNP, creatinine was 1.3. TSH normal. Lower extremity edema spent due to sedentary lifestyle and consumption of high sodium foods. Patient's daughter reported that she started having swelling in her legs around Christmas time, was not treated with any medication for the edema as she also had COVID recently. Patient had tested positive for COVID on 11/01/2020 and was put in isolation for 10 days. Per daughter, she was having shortness of breath previously which has now resolved, no vomiting or diarrhea. Patient was sent by the facility to the hospital for evaluation of worsening kidney function and swelling in the legs.  In ED, afebrile, bradycardia with heart rate in 50s, BP 91/52, hemoglobin 10.5, creatinine 2.3, baseline 0.8-0.9. COVID-19 test on 11/14/2020 positive   Assessment & Plan    Principal Problem:   AKI (acute kidney injury) (HCC) this is likely prerenal due to recent COVID infection and medication use.  She had poor p.o. intake while here.  Have ordered scheduled fluids for her to take by mouth as a nursing order will document percent meals.  Strict I's and O's.  Creatinine is  minimally improved today.  She appears to be at her baseline mental status.  We will continue to hold nephrotoxic agents as below. -Baseline creatinine 0.8-0.9, presented with creatinine of 2.3, was 1.3 during recent urgent care visit on 10/29/2020 -Lisinopril, HCTZ held, patient received gentle IV fluid hydration in ED, currently KVO -Creatinine improved to 1.30   Active Problems:   Bilateral lower extremity edema - Duplex pending (patient refused on 1/22, discussed with nursing at length, patient unable to understand need for this, also attempted to call son to explain).  - Echo normal, TSH normal - Suspect may be due to multiple medications which predispose to edema, including gabapentin (which is relatively high dose)   Hypertension -BP mildly elevated, holding ACE inhibitor, HCTZ due to AKI - will start on low-dose Lopressor for elevated BP however given overnight asymptomatic bradycardia will hold off beta-blocker as well. Heart rate 76 -Continue IV hydralazine as needed with parameters  Recent COVID-19 viral infection -Repeat COVID-19 test positive, currently no hypoxia. Chest x-ray without evidence of pneumonia - Monitor for symptoms   Advanced dementia, depression Resume home meds  - Per medication reconciliation, today  restarted donepezil, Namenda, Depakote 125 mg 3 times a day, escitalopram, and Risperdal.  Hold trazodone at this time could get a nighttime dose if she is agitated tonight.  Would try to avoid haloperidol given its association with adverse events.  Normocytic anemia -Anemia panel showed ferritin 48, folate 16.6, normal B12 513, likely anemia of chronic disease -No acute complaints, no active bleeding  Estimated body mass index is 22.13 kg/m as calculated from the following:   Height as of 02/04/16: 5\' 2"  (1.575 m).   Weight as of 02/04/16: 54.9 kg.  Code Status: DNR DVT Prophylaxis: Heparin subcu Family Communication: Discussed with patient, who has some  insight into her condition, attempted to call son, unable to reach  Disposition Plan:     Status is: Inpatient  Remains inpatient appropriate because:Inpatient level of care appropriate due to severity of illness   Dispo: The patient is from: Home              Anticipated d/c is to: Home              Anticipated d/c date is: 2 days              Patient currently is not medically stable to d/c. Awaiting Duplex--patient declined this on 1/21, discussed importance May be able to discharge 1/23 if creatinine stable and duplex negative   Time Spent in minutes   35 minutes  Procedures:  2D echo Consultants:   None  Antimicrobials:   Anti-infectives (From admission, onward)   None         Medications  Scheduled Meds: . divalproex  125 mg Oral TID  . donepezil  10 mg Oral Daily  . escitalopram  10 mg Oral Daily  . heparin  5,000 Units Subcutaneous Q8H  . loratadine  10 mg Oral Daily  . memantine  10 mg Oral BID  . risperiDONE  0.5 mg Oral BID   Continuous Infusions: PRN Meds:.acetaminophen **OR** acetaminophen, hydrALAZINE      Subjective:   Clent RidgesHeide Mccord was seen and examined today.  She has no complaints but reports her right arm hurts at the part of a side prior IV insertion.  Denies abdominal pain, chest pain or dyspnea.  She is uncertain why she is in the hospital but does not want a duplex.  She is not able to say which hospital she is in or what day of the week it is.  Objective:   Vitals:   11/15/20 2100 11/15/20 2258 11/16/20 0534 11/16/20 1509  BP:  (!) 193/98 132/87 132/81  Pulse: 77 83 74 (!) 103  Resp: 18 17 17 18   Temp: 98.9 F (37.2 C) 98 F (36.7 C) 99 F (37.2 C) 99.3 F (37.4 C)  TempSrc: Oral Axillary Axillary Oral  SpO2: 98% 97% 96% 96%    Intake/Output Summary (Last 24 hours) at 11/16/2020 1516 Last data filed at 11/16/2020 [id number] Gross per 24 hour  Intake --  Output 300 ml  Net -300 ml     Wt Readings from Last 3 Encounters:   02/04/16 54.9 kg  10/23/15 54.4 kg  10/07/15 53.5 kg     Exam  General: Alert and oriented x self, NAD   Cardiovascular: S1 S2 auscultated, no murmurs, RRR  Respiratory: Clear to auscultation bilaterally,  Gastrointestinal: Soft, nontender, nondistended, + bowel sounds  Ext: 1+ pedal edema bilaterally  Neuro: no new deficits  Musculoskeletal: No digital cyanosis, clubbing  Skin: No rashes  On right upper  extremity there is a small area of ecchymoses just distal to the antecubital fossa which is minimally tender full range of motion of elbow without effusion or overlying erythema or other ecchymoses.  Psych: Appropriate and talkative.   Data Reviewed:  I have personally reviewed following labs and imaging studies  Micro Results Recent Results (from the past 240 hour(s))  SARS CORONAVIRUS 2 (TAT 6-24 HRS) Nasopharyngeal Nasopharyngeal Swab     Status: Abnormal   Collection Time: 11/14/20  7:18 PM   Specimen: Nasopharyngeal Swab  Result Value Ref Range Status   SARS Coronavirus 2 POSITIVE (A) NEGATIVE Final    Comment: (NOTE) SARS-CoV-2 target nucleic acids are DETECTED.  The SARS-CoV-2 RNA is generally detectable in upper and lower respiratory specimens during the acute phase of infection. Positive results are indicative of the presence of SARS-CoV-2 RNA. Clinical correlation with patient history and other diagnostic information is  necessary to determine patient infection status. Positive results do not rule out bacterial infection or co-infection with other viruses.  The expected result is Negative.  Fact Sheet for Patients: HairSlick.no  Fact Sheet for Healthcare Providers: quierodirigir.com  This test is not yet approved or cleared by the [place] FDA and  has been authorized for detection and/or diagnosis of SARS-CoV-2 by FDA under an Emergency Use Authorization (EUA). This EUA will remain  in  effect (meaning this test can be used) for the duration of the COVID-19 declaration under Section 564(b)(1) of the Act, 21 U. S.C. section 360bbb-3(b)(1), unless the authorization is terminated or revoked sooner.   Performed at [facility], [address], [place], [place] [address]     Radiology Reports DG Chest Portable 1 View  Result Date: 11/14/2020 CLINICAL DATA:  Edema EXAM: PORTABLE CHEST 1 VIEW COMPARISON:  Portable exam 1613 hours compared to 10/29/2020 FINDINGS: Enlargement of cardiac silhouette. Mediastinal contours and pulmonary vascularity normal. Atherosclerotic calcification aorta. Lungs clear. No pulmonary infiltrate, pleural effusion, or pneumothorax. Osseous structures unremarkable. IMPRESSION: Enlargement of cardiac silhouette without acute abnormalities. Aortic Atherosclerosis (ICD10-I70.0). Electronically Signed   By: Ulyses Southward M.D.   On: 11/14/2020 16:31   ECHOCARDIOGRAM COMPLETE  Result Date: 11/15/2020    ECHOCARDIOGRAM REPORT   Patient Name:   CORTEZ STEELMAN Date of Exam: 11/15/2020 Medical Rec #:  [id number]  Height:       62.0 in Accession #:    [id number] Weight:       121.0 lb Date of Birth:  [birth year] BSA:          1.544 m Patient Age:    79 years   BP:           137/108 mmHg Patient Gender: F          HR:           86 bpm. Exam Location:  Inpatient Procedure: 2D Echo Indications:    acute diastolic chf  History:        Patient has no prior history of Echocardiogram examinations.                 Arrythmias:bradycardia.  Sonographer:    Delcie Roch Referring Phys: [id number] RIPUDEEP K RAI  Sonographer Comments: Image acquisition challenging due to uncooperative patient. IMPRESSIONS  1. Left ventricular ejection fraction, by estimation, is 60 to 65%. The left ventricle has normal function. The left ventricle has no regional wall motion abnormalities. Left ventricular diastolic parameters are consistent with Grade I diastolic dysfunction (impaired relaxation).  2.  Right ventricular systolic function is normal. The right ventricular size is normal. There is normal pulmonary artery systolic pressure.  3. The mitral valve is normal in structure. Trivial mitral valve regurgitation. No evidence of mitral stenosis.  4. The aortic valve is normal in structure. There is mild calcification of the aortic valve. There is mild thickening of the aortic valve. Aortic valve regurgitation is not visualized. No aortic stenosis is present.  5. The inferior vena cava is normal in size with greater than 50% respiratory variability, suggesting right atrial pressure of 3 mmHg. FINDINGS  Left Ventricle: Left ventricular ejection fraction, by estimation, is 60 to 65%. The left ventricle has normal function. The left ventricle has no regional wall motion abnormalities. The left ventricular internal cavity size was normal in size. There is  no left ventricular hypertrophy. Left ventricular diastolic parameters are consistent with Grade I diastolic dysfunction (impaired relaxation). Indeterminate filling pressures. Right Ventricle: The right ventricular size is normal. No increase in right ventricular wall thickness. Right ventricular systolic function is normal. There is normal pulmonary artery systolic pressure. The tricuspid regurgitant velocity is 2.50 m/s, and  with an assumed right atrial pressure of 3 mmHg, the estimated right ventricular systolic pressure is 28.0 mmHg. Left Atrium: Left atrial size was normal in size. Right Atrium: Right atrial size was normal in size. Pericardium: There is no evidence of pericardial effusion. Mitral Valve: The mitral valve is normal in structure. Trivial mitral valve regurgitation. No evidence of mitral valve stenosis. Tricuspid Valve: The tricuspid valve is normal in structure. Tricuspid valve regurgitation is trivial. No evidence of tricuspid stenosis. Aortic Valve: The aortic valve is normal in structure. There is mild calcification of the aortic valve.  There is mild thickening of the aortic valve. Aortic valve regurgitation is not visualized. No aortic stenosis is present. Pulmonic Valve: The pulmonic valve was normal in structure. Pulmonic valve regurgitation is not visualized. No evidence of pulmonic stenosis. Aorta: The aortic root is normal in size and structure. Venous: The inferior vena cava is normal in size with greater than 50% respiratory variability, suggesting right atrial pressure of 3 mmHg. IAS/Shunts: No atrial level shunt detected by color flow Doppler.  LEFT VENTRICLE PLAX 2D LVIDd:         3.90 cm  Diastology LVIDs:         2.30 cm  LV e' medial:    5.98 cm/s LV PW:         1.16 cm  LV E/e' medial:  9.0 LV IVS:        1.13 cm  LV e' lateral:   8.92 cm/s LVOT diam:     1.80 cm  LV E/e' lateral: 6.1 LV SV:         50 LV SV Index:   32 LVOT Area:     2.54 cm  RIGHT VENTRICLE RV S prime:     20.70 cm/s TAPSE (M-mode): 2.3 cm LEFT ATRIUM             Index       RIGHT ATRIUM           Index LA diam:        3.10 cm 2.01 cm/m  RA Area:     11.50 cm LA Vol (A2C):   41.5 ml 26.88 ml/m RA Volume:   22.40 ml  14.51 ml/m LA Vol (A4C):   35.2 ml 22.80 ml/m LA Biplane Vol: 39.3 ml 25.45 ml/m  AORTIC VALVE LVOT Vmax:  101.00 cm/s LVOT Vmean:  68.900 cm/s LVOT VTI:    0.196 m  AORTA Ao Asc diam: 3.50 cm MITRAL VALVE               TRICUSPID VALVE MV Area (PHT): 2.69 cm    TR Peak grad:   25.0 mmHg MV Decel Time: 282 msec    TR Vmax:        250.00 cm/s MV E velocity: 54.00 cm/s MV A velocity: 80.10 cm/s  SHUNTS MV E/A ratio:  0.67        Systemic VTI:  0.20 m                            Systemic Diam: 1.80 cm Chilton Siiffany [place] MD Electronically signed by Chilton Siiffany Lantana MD Signature Date/Time: 11/15/2020/1:51:45 PM    Final     Lab Data:  CBC: Recent Labs  Lab 11/14/20 1700 11/15/20 0728 11/16/20 0212  WBC 5.5 10.1 9.2  NEUTROABS 3.2  --   --   HGB 10.5* 12.0 12.1  HCT 31.8* 35.6* 37.5  MCV 98.1 93.7 94.2  PLT 181 209 227   Basic Metabolic  Panel: Recent Labs  Lab 11/14/20 1549 11/15/20 0728 11/16/20 0212  NA 141 142 138  K 3.9 3.5 3.7  CL 104 102 102  CO2 25 27 20*  GLUCOSE 95 108* 129*  BUN 54* 35* 28*  CREATININE 2.34* 1.31* 1.30*  CALCIUM 8.8* 9.5 9.7   GFR: CrCl cannot be calculated (Unknown ideal weight.). Liver Function Tests: Recent Labs  Lab 11/14/20 1549 11/15/20 0728  AST 28 26  ALT 13 16  ALKPHOS 85 94  BILITOT 0.7 0.6  PROT 6.1* 6.7  ALBUMIN 3.2* 3.4*   No results for input(s): LIPASE, AMYLASE in the last 168 hours. No results for input(s): AMMONIA in the last 168 hours. Coagulation Profile: No results for input(s): INR, PROTIME in the last 168 hours. Cardiac Enzymes: No results for input(s): CKTOTAL, CKMB, CKMBINDEX, TROPONINI in the last 168 hours. BNP (last 3 results) No results for input(s): PROBNP in the last 8760 hours. HbA1C: No results for input(s): HGBA1C in the last 72 hours. CBG: No results for input(s): GLUCAP in the last 168 hours. Lipid Profile: No results for input(s): CHOL, HDL, LDLCALC, TRIG, CHOLHDL, LDLDIRECT in the last 72 hours. Thyroid Function Tests: Recent Labs    11/15/20 0728  TSH 0.595   Anemia Panel: Recent Labs    11/15/20 0156  VITAMINB12 513  FOLATE 16.6  FERRITIN 248  TIBC 343  IRON 75  RETICCTPCT 0.9   Urine analysis:    Component Value Date/Time   COLORURINE STRAW (A) 06/21/2020 2006   APPEARANCEUR CLEAR 06/21/2020 2006   LABSPEC 1.015 06/21/2020 2006   PHURINE 6.0 06/21/2020 2006   GLUCOSEU NEGATIVE 06/21/2020 2006   HGBUR SMALL (A) 06/21/2020 2006   BILIRUBINUR NEGATIVE 06/21/2020 2006   KETONESUR NEGATIVE 06/21/2020 2006   PROTEINUR 100 (A) 06/21/2020 2006   UROBILINOGEN 0.2 05/05/2014 1344   NITRITE NEGATIVE 06/21/2020 2006   LEUKOCYTESUR NEGATIVE 06/21/2020 2006     Jane Broughton Bartholome BillM Ramil Edgington M.D. [facility] 11/16/2020, 3:16 PM   Call night coverage person covering after 7pm

## 2020-11-17 ENCOUNTER — Inpatient Hospital Stay (HOSPITAL_COMMUNITY): Payer: Medicare Other

## 2020-11-17 DIAGNOSIS — U071 COVID-19: Secondary | ICD-10-CM | POA: Diagnosis not present

## 2020-11-17 DIAGNOSIS — R7989 Other specified abnormal findings of blood chemistry: Secondary | ICD-10-CM

## 2020-11-17 DIAGNOSIS — M7989 Other specified soft tissue disorders: Secondary | ICD-10-CM | POA: Diagnosis not present

## 2020-11-17 DIAGNOSIS — N179 Acute kidney failure, unspecified: Secondary | ICD-10-CM | POA: Diagnosis not present

## 2020-11-17 LAB — BASIC METABOLIC PANEL
Anion gap: 11 (ref 5–15)
BUN: 34 mg/dL — ABNORMAL HIGH (ref 8–23)
CO2: 26 mmol/L (ref 22–32)
Calcium: 9.5 mg/dL (ref 8.9–10.3)
Chloride: 105 mmol/L (ref 98–111)
Creatinine, Ser: 1.42 mg/dL — ABNORMAL HIGH (ref 0.44–1.00)
GFR, Estimated: 37 mL/min — ABNORMAL LOW (ref >60)
Glucose, Bld: 100 mg/dL — ABNORMAL HIGH (ref 70–99)
Potassium: 3.5 mmol/L (ref 3.5–5.1)
Sodium: 142 mmol/L (ref 135–145)

## 2020-11-17 LAB — MRSA PCR SCREENING: MRSA by PCR: NEGATIVE

## 2020-11-17 LAB — D-DIMER, QUANTITATIVE: D-Dimer, Quant: 2.84 ug/mL-FEU — ABNORMAL HIGH (ref 0.00–0.50)

## 2020-11-17 LAB — CBC
HCT: 34.1 % — ABNORMAL LOW (ref 36.0–46.0)
Hemoglobin: 11.1 g/dL — ABNORMAL LOW (ref 12.0–15.0)
MCH: 30.8 pg (ref 26.0–34.0)
MCHC: 32.6 g/dL (ref 30.0–36.0)
MCV: 94.7 fL (ref 80.0–100.0)
Platelets: 200 10*3/uL (ref 150–400)
RBC: 3.6 MIL/uL — ABNORMAL LOW (ref 3.87–5.11)
RDW: 13.6 % (ref 11.5–15.5)
WBC: 7.8 10*3/uL (ref 4.0–10.5)
nRBC: 0 % (ref 0.0–0.2)

## 2020-11-17 NOTE — Evaluation (Signed)
Physical Therapy Evaluation and Discharge Patient Details Name: Angela Griffin MRN: [id number] DOB: 1941-02-13 Today's Date: 11/17/2020   History of Present Illness  80 year old female with history of advanced dementia, arthritis, diverticulitis, hypertension presented to ED with bilateral lower extremity edema. COVID+ 11/01/20; bil dopplers of LEs negtive for DVTs  Clinical Impression   Patient evaluated by Physical Therapy with no further PT needs identified. Patient normally walks with supervision/min guard assist (per daughter's description to RN earlier today). Patient with h/o dementia and able to walk with one person assist with RW x 100 ft. Feel pt is safe to return to her facility with no further PT needs.  PT is signing off. Thank you for this referral.     Follow Up Recommendations No PT follow up;Supervision/Assistance - 24 hour    Equipment Recommendations  None recommended by PT    Recommendations for Other Services       Precautions / Restrictions Precautions Precautions: Fall      Mobility  Bed Mobility Overal bed mobility: Needs Assistance Bed Mobility: Supine to Sit     Supine to sit: Min assist     General bed mobility comments: pt does not initiate movement to verbal cues; requires tactile cues and then reaching to pull up on therapist's hand    Transfers Overall transfer level: Needs assistance Equipment used: Rolling walker (2 wheeled);None Transfers: Sit to/from Stand Sit to Stand: Min guard         General transfer comment: pt initially stated no to RW; after walking short distance wiht unsteadiness noted, returned to sitting and used RW for 2nd transfer; required cues to use safely  Ambulation/Gait Ambulation/Gait assistance: Min guard Gait Distance (Feet): 100 Feet Assistive device: Rolling walker (2 wheeled);None Gait Pattern/deviations: Step-through pattern;Decreased stride length     General Gait Details: without device x 5 ft with  unsteadiness and guarding noted; with RW much more relaxed and walked 100 ft  Stairs            Wheelchair Mobility    Modified Rankin (Stroke Patients Only)       Balance Overall balance assessment: Mild deficits observed, not formally tested                                           Pertinent Vitals/Pain Pain Assessment: Faces Faces Pain Scale: No hurt    Home Living Family/patient expects to be discharged to:: Other (Comment) (? memory care unit vs ALF (whichever level of care she came from))                      Prior Function Level of Independence: Needs assistance   Gait / Transfers Assistance Needed: Daughter spoke to RN and pt walks with RW with supervision (daughter feels safe walking her)           Hand Dominance        Extremity/Trunk Assessment   Upper Extremity Assessment Upper Extremity Assessment: Overall WFL for tasks assessed    Lower Extremity Assessment Lower Extremity Assessment: Overall WFL for tasks assessed    Cervical / Trunk Assessment Cervical / Trunk Assessment: Normal  Communication   Communication: Other (comment) (does not engage in conversation)  Cognition Arousal/Alertness: Awake/alert Behavior During Therapy: Flat affect Overall Cognitive Status: No family/caregiver present to determine baseline cognitive functioning  General Comments: h/o dementia; unclear if at baseline      General Comments      Exercises     Assessment/Plan    PT Assessment Patent does not need any further PT services  PT Problem List         PT Treatment Interventions      PT Goals (Current goals can be found in the Care Plan section)  Acute Rehab PT Goals Patient Stated Goal: none stated; nearly non-verbal PT Goal Formulation: All assessment and education complete, DC therapy    Frequency     Barriers to discharge        Co-evaluation                AM-PAC PT "6 Clicks" Mobility  Outcome Measure Help needed turning from your back to your side while in a flat bed without using bedrails?: A Little Help needed moving from lying on your back to sitting on the side of a flat bed without using bedrails?: A Little Help needed moving to and from a bed to a chair (including a wheelchair)?: A Little Help needed standing up from a chair using your arms (e.g., wheelchair or bedside chair)?: A Little Help needed to walk in hospital room?: A Little Help needed climbing 3-5 steps with a railing? : A Little 6 Click Score: 18    End of Session Equipment Utilized During Treatment: Gait belt Activity Tolerance: Patient tolerated treatment well Patient left: in chair;with bed alarm set;with call bell/phone within reach Nurse Communication: Mobility status;Other (comment) (waist belt alarm set) PT Visit Diagnosis: Unsteadiness on feet (R26.81)    Time: 2751-7001 PT Time Calculation (min) (ACUTE ONLY): 31 min   Charges:   PT Evaluation $PT Eval Low Complexity: 1 Low PT Treatments $Gait Training: 8-22 mins         Jerolyn Center, PT Pager [phone]   Zena Amos 11/17/2020, 1:58 PM

## 2020-11-17 NOTE — Progress Notes (Signed)
PROGRESS NOTE    Angela Griffin  UTM:[id number]  DOB: 1941/06/26  DOA: 11/14/2020 PCP: Patient, No Pcp Per Outpatient Specialists:   Hospital course:  Patient is a 80 year old female with history of advanced dementia, arthritis, diverticulitis, hypertension and recent diagnosis of COVID infection 11/01/2020 was admitted 11/14/2020 for bilateral lower extremity edema and shortness of breath onset around Christmas. Patient was seen by PCP and sent to ED for noted worsening renal function and increasing lower extremity edema.   Subjective:  Patient does does not appear to want to speak with me. She looks at me somewhat blankly. She is awake and alert. However does not seem to want to answer any questions.   Objective: Vitals:   11/16/20 0534 11/16/20 1509 11/16/20 2005 11/17/20 0301  BP: 132/87 132/81 (!) 166/87 (!) 154/91  Pulse: 74 (!) 103 72 65  Resp: 17 18    Temp: 99 F (37.2 C) 99.3 F (37.4 C) 99.3 F (37.4 C) 98.7 F (37.1 C)  TempSrc: Axillary Oral Oral Oral  SpO2: 96% 96% 96% 94%    Intake/Output Summary (Last 24 hours) at 11/17/2020 1310 Last data filed at 11/17/2020 0400 Gross per 24 hour  Intake 150 ml  Output 150 ml  Net 0 ml   There were no vitals filed for this visit.   Exam:  General: Patient sitting up in bed appearing physically comfortable but looking blank. Eyes: sclera anicteric CVS: S1-S2, regular  Respiratory:  decreased air entry bilaterally secondary to decreased inspiratory effort, rales at bases  GI: NABS, soft, NT  LE: No edema.  Neuro: A/O x 3, Moving all extremities equally with normal strength, CN 3-12 intact, grossly nonfocal.  Psych: patient is logical and coherent, judgement and insight appear normal, mood and affect appropriate to situation.   Assessment & Plan:   80 year old female admitted 3 days ago with acute kidney injury and increasing lower extremity edema.  Acute kidney injury Thought to be prerenal and most likely  secondary to poor p.o. intake. HCTZ and lisinopril are being held and patient received some IV fluids in the ED. Creatinine was 2.3 on admission and has improved to about 1.3, today it is 1.4 Baseline creatinine appears to be around 1.0  Bilateral lower extremity edema Duplex Dopplers are done today and results are pending Echocardiogram and TSH are normal Thought to be secondary to medications perhaps gabapentin However patient may well have venous stasis disease and possibly dietary discretion with increased salt intake. If duplex Dopplers are negative, can consider referral for outpatient Unna boots.  HTN Lisinopril and HCTZ are being held due to AKI Patient was started on Lopressor however patient had some asymptomatic bradycardia so this was discontinued Would restart HCTZ or Lasix once creatinine has normalized and if duplex Dopplers are negative.  Recent COVID infection Diagnosed 11/02/2019 presently asymptomatic   DVT prophylaxis: Subcu heparin Code Status: DNR Family Communication: None Disposition Plan:   Patient is from: Home  Anticipated Discharge Location: Home  Barriers to Discharge: Awaiting results of duplex Dopplers and for improvement of creatinine  Is patient medically stable for Discharge: Not yet   Consultants:  None  Procedures:  None  Antimicrobials:  None   Data Reviewed:  Basic Metabolic Panel: Recent Labs  Lab 11/14/20 1549 11/15/20 0728 11/16/20 0212 11/17/20 0122  NA 141 142 138 142  K 3.9 3.5 3.7 3.5  CL 104 102 102 105  CO2 25 27 20* 26  GLUCOSE 95 108* 129* 100*  BUN  54* 35* 28* 34*  CREATININE 2.34* 1.31* 1.30* 1.42*  CALCIUM 8.8* 9.5 9.7 9.5   Liver Function Tests: Recent Labs  Lab 11/14/20 1549 11/15/20 0728  AST 28 26  ALT 13 16  ALKPHOS 85 94  BILITOT 0.7 0.6  PROT 6.1* 6.7  ALBUMIN 3.2* 3.4*   No results for input(s): LIPASE, AMYLASE in the last 168 hours. No results for input(s): AMMONIA in the last 168  hours. CBC: Recent Labs  Lab 11/14/20 1700 11/15/20 0728 11/16/20 0212 11/17/20 0122  WBC 5.5 10.1 9.2 7.8  NEUTROABS 3.2  --   --   --   HGB 10.5* 12.0 12.1 11.1*  HCT 31.8* 35.6* 37.5 34.1*  MCV 98.1 93.7 94.2 94.7  PLT 181 209 227 200   Cardiac Enzymes: No results for input(s): CKTOTAL, CKMB, CKMBINDEX, TROPONINI in the last 168 hours. BNP (last 3 results) No results for input(s): PROBNP in the last 8760 hours. CBG: No results for input(s): GLUCAP in the last 168 hours.  Recent Results (from the past 240 hour(s))  SARS CORONAVIRUS 2 (TAT 6-24 HRS) Nasopharyngeal Nasopharyngeal Swab     Status: Abnormal   Collection Time: 11/14/20  7:18 PM   Specimen: Nasopharyngeal Swab  Result Value Ref Range Status   SARS Coronavirus 2 POSITIVE (A) NEGATIVE Final    Comment: (NOTE) SARS-CoV-2 target nucleic acids are DETECTED.  The SARS-CoV-2 RNA is generally detectable in upper and lower respiratory specimens during the acute phase of infection. Positive results are indicative of the presence of SARS-CoV-2 RNA. Clinical correlation with patient history and other diagnostic information is  necessary to determine patient infection status. Positive results do not rule out bacterial infection or co-infection with other viruses.  The expected result is Negative.  Fact Sheet for Patients: HairSlick.no  Fact Sheet for Healthcare Providers: quierodirigir.com  This test is not yet approved or cleared by the [place] FDA and  has been authorized for detection and/or diagnosis of SARS-CoV-2 by FDA under an Emergency Use Authorization (EUA). This EUA will remain  in effect (meaning this test can be used) for the duration of the COVID-19 declaration under Section 564(b)(1) of the Act, 21 U. S.C. section 360bbb-3(b)(1), unless the authorization is terminated or revoked sooner.   Performed at [facility], [address] [place], [place] [address]       Studies: VAS [place] LOWER EXTREMITY VENOUS (DVT)  Result Date: 11/17/2020  Lower Venous DVT Study Indications: Swelling, d-dimer, Covid.  Comparison Study: No prior studies. Performing Technologist: Jean Rosenthal RDMS  Examination Guidelines: A complete evaluation includes B-mode imaging, spectral Doppler, color Doppler, and power Doppler as needed of all accessible portions of each vessel. Bilateral testing is considered an integral part of a complete examination. Limited examinations for reoccurring indications may be performed as noted. The reflux portion of the exam is performed with the patient in reverse Trendelenburg.  +---------+---------------+---------+-----------+----------+--------------+ RIGHT    CompressibilityPhasicitySpontaneityPropertiesThrombus Aging +---------+---------------+---------+-----------+----------+--------------+ CFV      Full           Yes      Yes                                 +---------+---------------+---------+-----------+----------+--------------+ SFJ      Full                                                        +---------+---------------+---------+-----------+----------+--------------+  FV Prox  Full                                                        +---------+---------------+---------+-----------+----------+--------------+ FV Mid   Full                                                        +---------+---------------+---------+-----------+----------+--------------+ FV DistalFull                                                        +---------+---------------+---------+-----------+----------+--------------+ PFV      Full                                                        +---------+---------------+---------+-----------+----------+--------------+ POP      Full           Yes      Yes                                  +---------+---------------+---------+-----------+----------+--------------+ PTV      Full                                                        +---------+---------------+---------+-----------+----------+--------------+ PERO     Full                                                        +---------+---------------+---------+-----------+----------+--------------+   +---------+---------------+---------+-----------+----------+--------------+ LEFT     CompressibilityPhasicitySpontaneityPropertiesThrombus Aging +---------+---------------+---------+-----------+----------+--------------+ CFV      Full           Yes      Yes                                 +---------+---------------+---------+-----------+----------+--------------+ SFJ      Full                                                        +---------+---------------+---------+-----------+----------+--------------+ FV Prox  Full                                                        +---------+---------------+---------+-----------+----------+--------------+  FV Mid   Full                                                        +---------+---------------+---------+-----------+----------+--------------+ FV DistalFull                                                        +---------+---------------+---------+-----------+----------+--------------+ PFV      Full                                                        +---------+---------------+---------+-----------+----------+--------------+ POP      Full           Yes      Yes                                 +---------+---------------+---------+-----------+----------+--------------+ PTV      Full                                                        +---------+---------------+---------+-----------+----------+--------------+ PERO     Full                                                         +---------+---------------+---------+-----------+----------+--------------+     Summary: RIGHT: - There is no evidence of deep vein thrombosis in the lower extremity.  - No cystic structure found in the popliteal fossa.  LEFT: - There is no evidence of deep vein thrombosis in the lower extremity.  - No cystic structure found in the popliteal fossa.  *See table(s) above for measurements and observations. Electronically signed by Waverly Ferrari MD on 11/17/2020 at 11:57:18 AM.    Final      Scheduled Meds: . divalproex  125 mg Oral TID  . donepezil  10 mg Oral Daily  . escitalopram  10 mg Oral Daily  . heparin  5,000 Units Subcutaneous Q8H  . loratadine  10 mg Oral Daily  . memantine  10 mg Oral BID  . risperiDONE  0.5 mg Oral BID   Continuous Infusions:  Principal Problem:   AKI (acute kidney injury) (HCC) Active Problems:   Bilateral lower extremity edema   Sinus bradycardia   Advanced dementia (HCC)   Anemia     Emslee Lopezmartinez Tublu Jahniah Pallas, [facility]  If 7PM-7AM, please contact night-coverage [email or web address] Password [email or web address] 11/17/2020, 1:10 PM    LOS: 3 days

## 2020-11-17 NOTE — Progress Notes (Signed)
Lower extremity venous bilateral study completed.   Please see CV Proc for preliminary results.   Sirenia Whitis, RDMS  

## 2020-11-18 ENCOUNTER — Inpatient Hospital Stay (HOSPITAL_COMMUNITY): Payer: Medicare Other

## 2020-11-18 DIAGNOSIS — N179 Acute kidney failure, unspecified: Secondary | ICD-10-CM | POA: Diagnosis not present

## 2020-11-18 LAB — CBC
HCT: 35.2 % — ABNORMAL LOW (ref 36.0–46.0)
Hemoglobin: 11.5 g/dL — ABNORMAL LOW (ref 12.0–15.0)
MCH: 31.2 pg (ref 26.0–34.0)
MCHC: 32.7 g/dL (ref 30.0–36.0)
MCV: 95.4 fL (ref 80.0–100.0)
Platelets: 190 10*3/uL (ref 150–400)
RBC: 3.69 MIL/uL — ABNORMAL LOW (ref 3.87–5.11)
RDW: 13.5 % (ref 11.5–15.5)
WBC: 9 10*3/uL (ref 4.0–10.5)
nRBC: 0 % (ref 0.0–0.2)

## 2020-11-18 LAB — BASIC METABOLIC PANEL
Anion gap: 12 (ref 5–15)
BUN: 43 mg/dL — ABNORMAL HIGH (ref 8–23)
CO2: 23 mmol/L (ref 22–32)
Calcium: 9.8 mg/dL (ref 8.9–10.3)
Chloride: 108 mmol/L (ref 98–111)
Creatinine, Ser: 1.46 mg/dL — ABNORMAL HIGH (ref 0.44–1.00)
GFR, Estimated: 36 mL/min — ABNORMAL LOW (ref >60)
Glucose, Bld: 110 mg/dL — ABNORMAL HIGH (ref 70–99)
Potassium: 3.5 mmol/L (ref 3.5–5.1)
Sodium: 143 mmol/L (ref 135–145)

## 2020-11-18 LAB — D-DIMER, QUANTITATIVE: D-Dimer, Quant: 2.47 ug/mL-FEU — ABNORMAL HIGH (ref 0.00–0.50)

## 2020-11-18 MED ORDER — SODIUM CHLORIDE 0.9 % IV SOLN
INTRAVENOUS | Status: DC
  Administered 2020-11-21: 1000 mL via INTRAVENOUS

## 2020-11-18 NOTE — Progress Notes (Signed)
PROGRESS NOTE    Angela Griffin  LDJ:[id number] DOB: 02-17-1941 DOA: 11/14/2020 PCP: Patient, No Pcp Per   Brief Narrative:  Patient is a 79 year old female with history of advanced dementia, arthritis, diverticulitis, hypertension and recent diagnosis of COVID infection 11/01/2020 was admitted 11/14/2020 for bilateral lower extremity edema and shortness of breath onset around Christmas. Patient was seen by PCP and sent to ED for noted worsening renal function and increasing lower extremity edema.  Assessment & Plan:   Principal Problem:   AKI (acute kidney injury) (HCC) Active Problems:   Bilateral lower extremity edema   Sinus bradycardia   Advanced dementia (HCC)   Anemia   Acute kidney injury/dehydration: Thought to be prerenal and most likely secondary to poor p.o. intake. HCTZ and lisinopril are being held and patient received some IV fluids in the ED. Creatinine was 2.3 on admission and has improved to about 1.3, today it is 1.4 again.  Clinically, she looks dehydrated.  Her breakfast tray is untouched.  She will be at high risk of returning back to ED due to dehydration if she were to discharge today.  She is not on any IV fluids either.  I will start her on normal saline now and repeat labs in the morning.  Hopefully we can discharge her tomorrow. Baseline creatinine appears to be around 1.0  Bilateral lower extremity edema Duplex Dopplers ruled out DVT. Echocardiogram and TSH are normal She does not have any edema on my exam.  HTN Lisinopril and HCTZ are being held due to AKI Patient was started on Lopressor however patient had some asymptomatic bradycardia so this was discontinued Would restart HCTZ or Lasix once creatinine has normalized However patient's blood pressure is decently stable.  Recent COVID infection Diagnosed 11/02/2019 presently asymptomatic  DVT prophylaxis: heparin injection 5,000 Units Start: 11/14/20 2200   Code Status: DNR  Family Communication:  None present at bedside.   Status is: Inpatient  Remains inpatient appropriate because:IV treatments appropriate due to intensity of illness or inability to take PO   Dispo:  Patient From: Home  Planned Disposition: To be determined  Expected discharge date: 11/17/2020  Medically stable for discharge: No          Estimated body mass index is 22.13 kg/m as calculated from the following:   Height as of 02/04/16: 5\' 2"  (1.575 m).   Weight as of 02/04/16: 54.9 kg.      Nutritional status:               Consultants:   None  Procedures:   None  Antimicrobials:  Anti-infectives (From admission, onward)   None         Subjective: Patient seen and examined.  She had flat facies but she is able to talk.  She has dementia.  She is pleasantly confused.  At her baseline.  Denied any complaint.  Objective: Vitals:   11/16/20 2005 11/17/20 0301 11/17/20 1449 11/18/20 0558  BP: (!) 166/87 (!) 154/91 (!) 149/74 (!) 142/75  Pulse: 72 65 92 90  Resp:   18 18  Temp: 99.3 F (37.4 C) 98.7 F (37.1 C) 98.4 F (36.9 C) 98.1 F (36.7 C)  TempSrc: Oral Oral Oral Oral  SpO2: 96% 94% 98% 98%    Intake/Output Summary (Last 24 hours) at 11/18/2020 1216 Last data filed at 11/18/2020 0600 Gross per 24 hour  Intake 360 ml  Output 600 ml  Net -240 ml   There were no vitals filed for this  visit.  Examination:  General exam: Appears calm and comfortable  Respiratory system: Clear to auscultation. Respiratory effort normal. Cardiovascular system: S1 & S2 heard, RRR. No JVD, murmurs, rubs, gallops or clicks. No pedal edema. Gastrointestinal system: Abdomen is nondistended, soft and nontender. No organomegaly or masses felt. Normal bowel sounds heard. Central nervous system: Alert and oriented. No focal neurological deficits. Extremities: Symmetric 5 x 5 power. Skin: No rashes, lesions or ulcers Psychiatry: Judgement and insight appear poor. Mood & affect  flat   Data Reviewed: I have personally reviewed following labs and imaging studies  CBC: Recent Labs  Lab 11/14/20 1700 11/15/20 0728 11/16/20 0212 11/17/20 0122 11/18/20 0349  WBC 5.5 10.1 9.2 7.8 9.0  NEUTROABS 3.2  --   --   --   --   HGB 10.5* 12.0 12.1 11.1* 11.5*  HCT 31.8* 35.6* 37.5 34.1* 35.2*  MCV 98.1 93.7 94.2 94.7 95.4  PLT 181 209 227 200 190   Basic Metabolic Panel: Recent Labs  Lab 11/14/20 1549 11/15/20 0728 11/16/20 0212 11/17/20 0122 11/18/20 0349  NA 141 142 138 142 143  K 3.9 3.5 3.7 3.5 3.5  CL 104 102 102 105 108  CO2 25 27 20* 26 23  GLUCOSE 95 108* 129* 100* 110*  BUN 54* 35* 28* 34* 43*  CREATININE 2.34* 1.31* 1.30* 1.42* 1.46*  CALCIUM 8.8* 9.5 9.7 9.5 9.8   GFR: CrCl cannot be calculated (Unknown ideal weight.). Liver Function Tests: Recent Labs  Lab 11/14/20 1549 11/15/20 0728  AST 28 26  ALT 13 16  ALKPHOS 85 94  BILITOT 0.7 0.6  PROT 6.1* 6.7  ALBUMIN 3.2* 3.4*   No results for input(s): LIPASE, AMYLASE in the last 168 hours. No results for input(s): AMMONIA in the last 168 hours. Coagulation Profile: No results for input(s): INR, PROTIME in the last 168 hours. Cardiac Enzymes: No results for input(s): CKTOTAL, CKMB, CKMBINDEX, TROPONINI in the last 168 hours. BNP (last 3 results) No results for input(s): PROBNP in the last 8760 hours. HbA1C: No results for input(s): HGBA1C in the last 72 hours. CBG: No results for input(s): GLUCAP in the last 168 hours. Lipid Profile: No results for input(s): CHOL, HDL, LDLCALC, TRIG, CHOLHDL, LDLDIRECT in the last 72 hours. Thyroid Function Tests: No results for input(s): TSH, T4TOTAL, FREET4, T3FREE, THYROIDAB in the last 72 hours. Anemia Panel: No results for input(s): VITAMINB12, FOLATE, FERRITIN, TIBC, IRON, RETICCTPCT in the last 72 hours. Sepsis Labs: No results for input(s): PROCALCITON, LATICACIDVEN in the last 168 hours.  Recent Results (from the past 240 hour(s))   SARS CORONAVIRUS 2 (TAT 6-24 HRS) Nasopharyngeal Nasopharyngeal Swab     Status: Abnormal   Collection Time: 11/14/20  7:18 PM   Specimen: Nasopharyngeal Swab  Result Value Ref Range Status   SARS Coronavirus 2 POSITIVE (A) NEGATIVE Final    Comment: (NOTE) SARS-CoV-2 target nucleic acids are DETECTED.  The SARS-CoV-2 RNA is generally detectable in upper and lower respiratory specimens during the acute phase of infection. Positive results are indicative of the presence of SARS-CoV-2 RNA. Clinical correlation with patient history and other diagnostic information is  necessary to determine patient infection status. Positive results do not rule out bacterial infection or co-infection with other viruses.  The expected result is Negative.  Fact Sheet for Patients: HairSlick.no  Fact Sheet for Healthcare Providers: quierodirigir.com  This test is not yet approved or cleared by the [place] FDA and  has been authorized for detection and/or  diagnosis of SARS-CoV-2 by FDA under an Emergency Use Authorization (EUA). This EUA will remain  in effect (meaning this test can be used) for the duration of the COVID-19 declaration under Section 564(b)(1) of the Act, 21 U. S.C. section 360bbb-3(b)(1), unless the authorization is terminated or revoked sooner.   Performed at [facility], [address] [place], [place] [address]   MRSA PCR Screening     Status: None   Collection Time: 11/17/20  4:20 PM   Specimen: Nasal Mucosa; Nasopharyngeal  Result Value Ref Range Status   MRSA by PCR NEGATIVE NEGATIVE Final    Comment:        The GeneXpert MRSA Assay (FDA approved for NASAL specimens only), is one component of a comprehensive MRSA colonization surveillance program. It is not intended to diagnose MRSA infection nor to guide or monitor treatment for MRSA infections. Performed at [facility], [address],  [place], [place] [address]       Radiology Studies: US RENAL  Result Date: 11/18/2020 CLINICAL DATA:  Acute kidney injury. EXAM: RENAL / URINARY TRACT ULTRASOUND COMPLETE COMPARISON:  None. FINDINGS: Right Kidney: Renal measurements: 9.7 x 8.7 x 4.3 cm = volume: 103 mL. Echogenicity within normal limits. No mass or hydronephrosis visualized. Left Kidney: Renal measurements: 10.3 x 5.7 by 4.7 cm = volume: 142.3 mL. Echogenicity within normal limits. No mass or hydronephrosis visualized. Bladder: Debris noted layering within the dependent portion of the bladder. Other: None. IMPRESSION: 1. Normal appearance of the kidneys.  No hydronephrosis identified. 2. Debris noted within the dependent portions of the urinary bladder. Electronically Signed   By: Signa Kell M.D.   On: 11/18/2020 09:21   VAS [place] LOWER EXTREMITY VENOUS (DVT)  Result Date: 11/17/2020  Lower Venous DVT Study Indications: Swelling, d-dimer, Covid.  Comparison Study: No prior studies. Performing Technologist: Jean Rosenthal RDMS  Examination Guidelines: A complete evaluation includes B-mode imaging, spectral Doppler, color Doppler, and power Doppler as needed of all accessible portions of each vessel. Bilateral testing is considered an integral part of a complete examination. Limited examinations for reoccurring indications may be performed as noted. The reflux portion of the exam is performed with the patient in reverse Trendelenburg.  +---------+---------------+---------+-----------+----------+--------------+ RIGHT    CompressibilityPhasicitySpontaneityPropertiesThrombus Aging +---------+---------------+---------+-----------+----------+--------------+ CFV      Full           Yes      Yes                                 +---------+---------------+---------+-----------+----------+--------------+ SFJ      Full                                                         +---------+---------------+---------+-----------+----------+--------------+ FV Prox  Full                                                        +---------+---------------+---------+-----------+----------+--------------+ FV Mid   Full                                                        +---------+---------------+---------+-----------+----------+--------------+  FV DistalFull                                                        +---------+---------------+---------+-----------+----------+--------------+ PFV      Full                                                        +---------+---------------+---------+-----------+----------+--------------+ POP      Full           Yes      Yes                                 +---------+---------------+---------+-----------+----------+--------------+ PTV      Full                                                        +---------+---------------+---------+-----------+----------+--------------+ PERO     Full                                                        +---------+---------------+---------+-----------+----------+--------------+   +---------+---------------+---------+-----------+----------+--------------+ LEFT     CompressibilityPhasicitySpontaneityPropertiesThrombus Aging +---------+---------------+---------+-----------+----------+--------------+ CFV      Full           Yes      Yes                                 +---------+---------------+---------+-----------+----------+--------------+ SFJ      Full                                                        +---------+---------------+---------+-----------+----------+--------------+ FV Prox  Full                                                        +---------+---------------+---------+-----------+----------+--------------+ FV Mid   Full                                                         +---------+---------------+---------+-----------+----------+--------------+ FV DistalFull                                                        +---------+---------------+---------+-----------+----------+--------------+  PFV      Full                                                        +---------+---------------+---------+-----------+----------+--------------+ POP      Full           Yes      Yes                                 +---------+---------------+---------+-----------+----------+--------------+ PTV      Full                                                        +---------+---------------+---------+-----------+----------+--------------+ PERO     Full                                                        +---------+---------------+---------+-----------+----------+--------------+     Summary: RIGHT: - There is no evidence of deep vein thrombosis in the lower extremity.  - No cystic structure found in the popliteal fossa.  LEFT: - There is no evidence of deep vein thrombosis in the lower extremity.  - No cystic structure found in the popliteal fossa.  *See table(s) above for measurements and observations. Electronically signed by Waverly Ferrarihristopher Dickson MD on 11/17/2020 at 11:57:18 AM.    Final     Scheduled Meds: . divalproex  125 mg Oral TID  . donepezil  10 mg Oral Daily  . escitalopram  10 mg Oral Daily  . heparin  5,000 Units Subcutaneous Q8H  . loratadine  10 mg Oral Daily  . memantine  10 mg Oral BID  . risperiDONE  0.5 mg Oral BID   Continuous Infusions: . sodium chloride       LOS: 4 days   Time spent: 30 minutes   Hughie Clossavi Adalaya Irion, MD [facility]  11/18/2020, 12:16 PM   To contact the attending provider between 7A-7P or the covering provider during after hours 7P-7A, please log into the web site [email or web address].

## 2020-11-18 NOTE — Evaluation (Signed)
Occupational Therapy Evaluation Patient Details Name: Angela Griffin MRN: [id number] DOB: [birth year] Today's Date: 11/18/2020    History of Present Illness 80 year old female with history of advanced dementia, arthritis, diverticulitis, hypertension presented to ED with bilateral lower extremity edema. COVID+ 11/01/20; bil dopplers of LEs negtive for DVTs   Clinical Impression   Pt ambulates with a RW and supervision, self feeds and is dependent in bathing, dressing and toileting. Pt minimally verbal asking, "What are you doing?" and "What did you do to my bed?"  Pt assisted OOB to chair with min (hand held) assist and set up for lunch. Ate a few grapes and a little applesauce only, refused any drinks. Pt stating, "I don't like that." Offered pt other types of food, unable to identify what she would rather eat. Pt likely very close her her baseline. No further OT needs, recommend return to facility.     Follow Up Recommendations  Other (comment) (Return to ALF/Memory Care facility)    Equipment Recommendations  None recommended by OT    Recommendations for Other Services       Precautions / Restrictions Precautions Precautions: Fall      Mobility Bed Mobility Overal bed mobility: Needs Assistance Bed Mobility: Supine to Sit     Supine to sit: Mod assist     General bed mobility comments: pulled up on therapist's hand to raise trunk, assist for LEs toward EOB    Transfers Overall transfer level: Needs assistance Equipment used: 1 person hand held assist Transfers: Sit to/from Stand;Stand Pivot Transfers Sit to Stand: Min assist Stand pivot transfers: Min assist       General transfer comment: pt declined using RW, reached for OT's hand    Balance Overall balance assessment: Mild deficits observed, not formally tested                                         ADL either performed or assessed with clinical judgement   ADL Overall ADL's : At baseline                                              Vision Baseline Vision/History: No visual deficits       Perception     Praxis      Pertinent Vitals/Pain Pain Assessment: Faces Faces Pain Scale: No hurt     Hand Dominance Right   Extremity/Trunk Assessment Upper Extremity Assessment Upper Extremity Assessment: Generalized weakness;Overall [facility] for tasks assessed   Lower Extremity Assessment Lower Extremity Assessment: Defer to PT evaluation   Cervical / Trunk Assessment Cervical / Trunk Assessment: Normal   Communication Communication Communication: Other (comment) (minimally verbal)   Cognition Arousal/Alertness: Awake/alert Behavior During Therapy: Flat affect Overall Cognitive Status: History of cognitive impairments - at baseline                                 General Comments: h/o dementia; unclear if at baseline   General Comments       Exercises     Shoulder Instructions      Home Living Family/patient expects to be discharged to:: Other (Comment) (ALF/Memory Care)  Prior Functioning/Environment Level of Independence: Needs assistance  Gait / Transfers Assistance Needed: pt walks with RW and supervision ADL's / Homemaking Assistance Needed: self feeds, assisted for bathing, dressing, incontinent            OT Problem List:        OT Treatment/Interventions:      OT Goals(Current goals can be found in the care plan section) Acute Rehab OT Goals Patient Stated Goal: none stated  OT Frequency:     Barriers to D/C:            Co-evaluation              AM-PAC OT "6 Clicks" Daily Activity     Outcome Measure Help from another person eating meals?: A Little Help from another person taking care of personal grooming?: A Lot Help from another person toileting, which includes using toliet, bedpan, or urinal?: Total Help from another person bathing  (including washing, rinsing, drying)?: A Lot Help from another person to put on and taking off regular upper body clothing?: A Lot Help from another person to put on and taking off regular lower body clothing?: Total 6 Click Score: 11   End of Session Equipment Utilized During Treatment: Gait belt Nurse Communication: Mobility status  Activity Tolerance: Patient tolerated treatment well Patient left: in chair;with call bell/phone within reach;with restraints reapplied  OT Visit Diagnosis: Other abnormalities of gait and mobility (R26.89);Other symptoms and signs involving cognitive function                Time: 9357-0177 OT Time Calculation (min): 23 min Charges:  OT General Charges $OT Visit: 1 Visit OT Evaluation $OT Eval Moderate Complexity: 1 Mod OT Treatments $Self Care/Home Management : 8-22 mins  Martie Round, OTR/L Acute Rehabilitation Services Pager: [phone] Office: [phone] Evern Bio 11/18/2020, 1:11 PM

## 2020-11-19 DIAGNOSIS — N179 Acute kidney failure, unspecified: Secondary | ICD-10-CM | POA: Diagnosis not present

## 2020-11-19 LAB — BASIC METABOLIC PANEL
Anion gap: 12 (ref 5–15)
BUN: 38 mg/dL — ABNORMAL HIGH (ref 8–23)
CO2: 23 mmol/L (ref 22–32)
Calcium: 9.4 mg/dL (ref 8.9–10.3)
Chloride: 109 mmol/L (ref 98–111)
Creatinine, Ser: 1.13 mg/dL — ABNORMAL HIGH (ref 0.44–1.00)
GFR, Estimated: 49 mL/min — ABNORMAL LOW (ref >60)
Glucose, Bld: 109 mg/dL — ABNORMAL HIGH (ref 70–99)
Potassium: 3.5 mmol/L (ref 3.5–5.1)
Sodium: 144 mmol/L (ref 135–145)

## 2020-11-19 MED ORDER — AMLODIPINE BESYLATE 5 MG PO TABS
5.0000 mg | ORAL_TABLET | Freq: Once | ORAL | Status: AC
  Administered 2020-11-19: 5 mg via ORAL
  Filled 2020-11-19: qty 1

## 2020-11-19 MED ORDER — AMLODIPINE BESYLATE 5 MG PO TABS
5.0000 mg | ORAL_TABLET | Freq: Every day | ORAL | Status: DC
  Administered 2020-11-19: 5 mg via ORAL
  Filled 2020-11-19: qty 1

## 2020-11-19 MED ORDER — AMLODIPINE BESYLATE 10 MG PO TABS
10.0000 mg | ORAL_TABLET | Freq: Every day | ORAL | Status: DC
  Administered 2020-11-20 – 2020-11-21 (×2): 10 mg via ORAL
  Filled 2020-11-19 (×3): qty 1

## 2020-11-19 NOTE — TOC Initial Note (Signed)
Transition of Care [facility]) - Initial/Assessment Note    Patient Details  Name: Angela Griffin MRN: [id number] Date of Birth: 05-31-1941  Transition of Care [facility]) CM/SW Contact:    Jimmy Picket, [place] Phone Number: 11/19/2020, 1:56 PM  Clinical Narrative:                  CSW spoke to pts son Sephen via phone. Pt lives at [facility] in the memory care unit. Sharlyne Pacas reports she has had a decline in her mobility the last couple months and reports the staff helps her more and more but is unsure about to what ability she can complete ADL's.   CSW spoke to [facility] and they are able to accept pt back at discharge. CSW was instructed to fax FL2  and DC summary to [facility] at [phone]. Sharlyne Pacas stated him or his sister can pick pt up when ready and transport back to facility.   Expected Discharge Plan: Assisted Living Barriers to Discharge: Continued Medical Work up   Patient Goals and CMS Choice Patient states their goals for this hospitalization and ongoing recovery are:: per son, to return to ALF CMS Medicare.gov Compare Post Acute Care list provided to:: Patient Choice offered to / list presented to : Adult Children  Expected Discharge Plan and Services Expected Discharge Plan: Assisted Living       Living arrangements for the past 2 months: Assisted Living Facility                                      Prior Living Arrangements/Services Living arrangements for the past 2 months: Assisted Living Facility Lives with:: Facility Resident Patient language and need for interpreter reviewed:: Yes        Need for Family Participation in Patient Care: Yes (Comment) Care giver support system in place?: Yes (comment)   Criminal Activity/Legal Involvement Pertinent to Current Situation/Hospitalization: No - Comment as needed  Activities of Daily Living Home Assistive Devices/Equipment: None ADL Screening (condition at time of admission) Patient's cognitive ability  adequate to safely complete daily activities?: No Is the patient deaf or have difficulty hearing?: No Does the patient have difficulty seeing, even when wearing glasses/contacts?: No Does the patient have difficulty concentrating, remembering, or making decisions?: Yes Patient able to express need for assistance with ADLs?: No Does the patient have difficulty dressing or bathing?: Yes Independently performs ADLs?: No Communication: Independent Toileting: Dependent Does the patient have difficulty walking or climbing stairs?: Yes Weakness of Legs: Both Weakness of Arms/Hands: Both  Permission Sought/Granted Permission sought to share information with : Facility Contractor granted to share information with : Yes, Verbal Permission Granted     Permission granted to share info w AGENCY: [facility]        Emotional Assessment Appearance:: Appears stated age Attitude/Demeanor/Rapport: Unable to Assess Affect (typically observed): Unable to Assess   Alcohol / Substance Use: Not Applicable Psych Involvement: No (comment)  Admission diagnosis:  Cardiomegaly [I51.7] Peripheral edema [R60.9] AKI (acute kidney injury) (HCC) [N17.9] Patient Active Problem List   Diagnosis Date Noted  . AKI (acute kidney injury) (HCC) 11/14/2020  . Bilateral lower extremity edema 11/14/2020  . Sinus bradycardia 11/14/2020  . Advanced dementia (HCC) 11/14/2020  . Anemia 11/14/2020  . Low back pain radiating to both legs 02/05/2016  . 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 Right hip pain 10/09/2015  . Toe pain,  right 09/02/2015  . Right leg pain 07/10/2015  . DDD (degenerative disc disease), lumbar 04/02/2015  . Pneumococcal vaccination declined 03/15/2015  . LBP (low back pain) 03/07/2015  . Change in blood platelet count 04/17/2014  . Arthritis, degenerative 04/17/2014  . Cannot sleep 04/17/2014  . Hemorrhoid 04/17/2014  . 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  Benign essential HTN 04/17/2014  . 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 Anxiety disorder 04/17/2014  . Abnormal thyroid stimulating hormone (TSH) level 04/17/2014  . Thrombocytopenia (HCC) 04/17/2014   PCP:  Patient, No Pcp Per Pharmacy:   [facility] - [place], [place] - [address] [place] [address] Phone: [phone] Fax: [phone]     Social Determinants of Health (SDOH) Interventions    Readmission Risk Interventions No flowsheet data found.  Jimmy Picket, Theresia Majors, [place] Clinical Social Worker [phone]

## 2020-11-19 NOTE — Progress Notes (Signed)
PROGRESS NOTE    Angela RidgesHeide Darius  JXB:[id number] DOB: August 15, 1941 DOA: 11/14/2020 PCP: Patient, No Pcp Per    Brief Narrative:  Angela Griffin is an 80 year old female with past medical history significant for advanced dementia, OA, diverticulitis, essential hypertension who presented to the ED via EMS for evaluation of bilateral lower extremity edema.  History provided by patient's daughter at bedside reports swelling in her legs around Christmas in which her thought was related to her sedentary lifestyle and eating high sodium foods during the holidays; patient's medications were not changed or added to.  Also patient recently tested positive for Covid-19 on 11/01/2020 and was placed in quarantine for 10 days.  Previous shortness of breath had now resolved.  Daughter reports the facility sent patient to the ED today due to worsening kidney function and swelling in her legs.  [facility] on 10/29/2020 for bilateral lower extremity edema and significant weight gain.  Normal BNP, creatinine 1.3 with a TSH within normal range.  Chest x-ray with no active cardiopulmonary disease process.  Patient was discharged back to her facility with recommendations of passive elevation of her lower extremities, compression stockings and modified dietary sodium intake.  In the ED, patient was afebrile, bradycardic with heart rates in the 50s.  BP was 91/52.  WBC 5.5, hemoglobin 10.5, hematocrit 31.8, MCV 98.1, platelet 181, sodium 141, potassium 3.9, chloride 104, bicarb 25, BUN 54, creatinine 2.3 (baseline 0.8-0.9).  LFTs within normal limits.  BMP within normal range.  SARS-CoV-2 PCR test positive.  Chest x-ray with enlargement of cardiac silhouette, aortic atherosclerosis and no acute cardiopulmonary disease process.  Hospital service was consulted for further evaluation and management of lower extremity IMA and acute renal failure.   Assessment & Plan:   Principal Problem:    AKI (acute kidney injury) (HCC) Active Problems:   Bilateral lower extremity edema   Sinus bradycardia   Advanced dementia (HCC)   Anemia   Acute renal failure Patient presenting from nursing facility with worsening renal function and peripheral edema.  Etiology likely secondary to poor oral intake in the setting of lisinopril and HCTZ use.  Patient was hydrated with IV fluids on presentation with improvement of her renal function. --Cr 2.3>1.46>1.13 --Continue IV fluid hydration with NS at 100 mL's per hour --Given her advanced dementia, concern for failure to thrive and lack of oral intake.  Would likely benefit from outpatient palliative care to follow on discharge. --Avoid nephrotoxins, renally dose all medications --Repeat BMP in a.m.  Bilateral lower extremity edema Etiology likely secondary to dietary indiscretions with high salt.  TTE with preserved LVEF, TSH within normal limits.  Bilateral duplex ultrasound lower extremities negative for DVT. --TED hose, elevation of lower extremities while lying/seated position  Essential hypertension BP 174/97 this morning.  On lisinopril/HCTZ outpatient which was discontinued due to acute renal failure.  Patient was started on Lopressor but developed some asymptomatic bradycardia so this was discontinued. --Start amlodipine 5 mg p.o. daily, monitor blood pressure closely and adjust as needed  Advanced dementia --Get up during the day --Encourage a familiar face to remain present throughout the day --Keep blinds open and lights on during daylight hours --Minimize the use of opioids/benzodiazepines  Recent Covid-19 infection Diagnosed 11/01/2020; asymptomatic.  No need for isolation as greater than 21 days from initial diagnosis.   DVT prophylaxis: Heparin   Code Status: DNR Family Communication: No family present at bedside this morning  Disposition Plan:  Level of care: Telemetry Medical Status is: Inpatient  Remains inpatient  appropriate because:Unsafe d/c plan and Inpatient level of care appropriate due to severity of illness   Dispo:  Patient From: Skilled Nursing Facility  Planned Disposition: Skilled Nursing Facility  Expected discharge date: 11/20/2020  Medically stable for discharge: No     Consultants:   None  Procedures:   TTE  Vascular duplex ultrasound bilateral lower extremities  Antimicrobials:   None   Subjective: Patient seen and examined bedside, resting comfortably in bed.  Pleasantly confused.  Has yet to eat her breakfast.  Suspect she continues with poor oral intake given her advanced dementia.  Renal function improved.  Starting amlodipine for elevated blood pressure this morning.  Unable to obtain any further ROS due to patient's baseline mental status.  No family present.  No acute concerns per nursing this morning.  Objective: Vitals:   11/18/20 1949 11/19/20 0500 11/19/20 0813 11/19/20 1135  BP: (!) 165/97 (!) 174/97 (!) 186/97 (!) 169/104  Pulse: 82 70 69 82  Resp: 20 20 20 18   Temp: 99.1 F (37.3 C) 98.6 F (37 C) 98.8 F (37.1 C) 98.4 F (36.9 C)  TempSrc: Oral Oral Oral Oral  SpO2: 95% 95% 98% 94%    Intake/Output Summary (Last 24 hours) at 11/19/2020 1339 Last data filed at 11/19/2020 0500 Gross per 24 hour  Intake 50 ml  Output 300 ml  Net -250 ml   There were no vitals filed for this visit.  Examination:  General exam: Appears calm and comfortable, elderly/chronically ill in appearance, pleasantly confused Respiratory system: Clear to auscultation. Respiratory effort normal.  On room air Cardiovascular system: S1 & S2 heard, RRR. No JVD, murmurs, rubs, gallops or clicks. No pedal edema. Gastrointestinal system: Abdomen is nondistended, soft and nontender. No organomegaly or masses felt. Normal bowel sounds heard. Central nervous system: Alert, not oriented to person/place/time/situation. No focal neurological deficits. Extremities: Symmetric 5 x 5  power. Skin: No rashes, lesions or ulcers Psychiatry: Judgement and insight appear poor.     Data Reviewed: I have personally reviewed following labs and imaging studies  CBC: Recent Labs  Lab 11/14/20 1700 11/15/20 0728 11/16/20 0212 11/17/20 0122 11/18/20 0349  WBC 5.5 10.1 9.2 7.8 9.0  NEUTROABS 3.2  --   --   --   --   HGB 10.5* 12.0 12.1 11.1* 11.5*  HCT 31.8* 35.6* 37.5 34.1* 35.2*  MCV 98.1 93.7 94.2 94.7 95.4  PLT 181 209 227 200 190   Basic Metabolic Panel: Recent Labs  Lab 11/15/20 0728 11/16/20 0212 11/17/20 0122 11/18/20 0349 11/19/20 0346  NA 142 138 142 143 144  K 3.5 3.7 3.5 3.5 3.5  CL 102 102 105 108 109  CO2 27 20* 26 23 23   GLUCOSE 108* 129* 100* 110* 109*  BUN 35* 28* 34* 43* 38*  CREATININE 1.31* 1.30* 1.42* 1.46* 1.13*  CALCIUM 9.5 9.7 9.5 9.8 9.4   GFR: CrCl cannot be calculated (Unknown ideal weight.). Liver Function Tests: Recent Labs  Lab 11/14/20 1549 11/15/20 0728  AST 28 26  ALT 13 16  ALKPHOS 85 94  BILITOT 0.7 0.6  PROT 6.1* 6.7  ALBUMIN 3.2* 3.4*   No results for input(s): LIPASE, AMYLASE in the last 168 hours. No results for input(s): AMMONIA in the last 168 hours. Coagulation Profile: No results for input(s): INR, PROTIME in the last 168 hours. Cardiac Enzymes: No results for input(s): CKTOTAL, CKMB, CKMBINDEX, TROPONINI in the last  168 hours. BNP (last 3 results) No results for input(s): PROBNP in the last 8760 hours. HbA1C: No results for input(s): HGBA1C in the last 72 hours. CBG: No results for input(s): GLUCAP in the last 168 hours. Lipid Profile: No results for input(s): CHOL, HDL, LDLCALC, TRIG, CHOLHDL, LDLDIRECT in the last 72 hours. Thyroid Function Tests: No results for input(s): TSH, T4TOTAL, FREET4, T3FREE, THYROIDAB in the last 72 hours. Anemia Panel: No results for input(s): VITAMINB12, FOLATE, FERRITIN, TIBC, IRON, RETICCTPCT in the last 72 hours. Sepsis Labs: No results for input(s):  PROCALCITON, LATICACIDVEN in the last 168 hours.  Recent Results (from the past 240 hour(s))  SARS CORONAVIRUS 2 (TAT 6-24 HRS) Nasopharyngeal Nasopharyngeal Swab     Status: Abnormal   Collection Time: 11/14/20  7:18 PM   Specimen: Nasopharyngeal Swab  Result Value Ref Range Status   SARS Coronavirus 2 POSITIVE (A) NEGATIVE Final    Comment: (NOTE) SARS-CoV-2 target nucleic acids are DETECTED.  The SARS-CoV-2 RNA is generally detectable in upper and lower respiratory specimens during the acute phase of infection. Positive results are indicative of the presence of SARS-CoV-2 RNA. Clinical correlation with patient history and other diagnostic information is  necessary to determine patient infection status. Positive results do not rule out bacterial infection or co-infection with other viruses.  The expected result is Negative.  Fact Sheet for Patients: HairSlick.no  Fact Sheet for Healthcare Providers: quierodirigir.com  This test is not yet approved or cleared by the [place] FDA and  has been authorized for detection and/or diagnosis of SARS-CoV-2 by FDA under an Emergency Use Authorization (EUA). This EUA will remain  in effect (meaning this test can be used) for the duration of the COVID-19 declaration under Section 564(b)(1) of the Act, 21 U. S.C. section 360bbb-3(b)(1), unless the authorization is terminated or revoked sooner.   Performed at [facility], [address], [place], [place] [address]   MRSA PCR Screening     Status: None   Collection Time: 11/17/20  4:20 PM   Specimen: Nasal Mucosa; Nasopharyngeal  Result Value Ref Range Status   MRSA by PCR NEGATIVE NEGATIVE Final    Comment:        The GeneXpert MRSA Assay (FDA approved for NASAL specimens only), is one component of a comprehensive MRSA colonization surveillance program. It is not intended to diagnose MRSA infection nor to guide  or monitor treatment for MRSA infections. Performed at [facility], [address], [place], [place] [address]          Radiology Studies: US RENAL  Result Date: 11/18/2020 CLINICAL DATA:  Acute kidney injury. EXAM: RENAL / URINARY TRACT ULTRASOUND COMPLETE COMPARISON:  None. FINDINGS: Right Kidney: Renal measurements: 9.7 x 8.7 x 4.3 cm = volume: 103 mL. Echogenicity within normal limits. No mass or hydronephrosis visualized. Left Kidney: Renal measurements: 10.3 x 5.7 by 4.7 cm = volume: 142.3 mL. Echogenicity within normal limits. No mass or hydronephrosis visualized. Bladder: Debris noted layering within the dependent portion of the bladder. Other: None. IMPRESSION: 1. Normal appearance of the kidneys.  No hydronephrosis identified. 2. Debris noted within the dependent portions of the urinary bladder. Electronically Signed   By: Signa Kell M.D.   On: 11/18/2020 09:21        Scheduled Meds: . amLODipine  5 mg Oral Daily  . divalproex  125 mg Oral TID  . donepezil  10 mg Oral Daily  . escitalopram  10 mg Oral  Daily  . heparin  5,000 Units Subcutaneous Q8H  . loratadine  10 mg Oral Daily  . memantine  10 mg Oral BID  . risperiDONE  0.5 mg Oral BID   Continuous Infusions: . sodium chloride 100 mL/hr at 11/18/20 1338     LOS: 5 days    Time spent: 35 minutes spent on chart review, discussion with nursing staff, consultants, updating family and interview/physical exam; more than 50% of that time was spent in counseling and/or coordination of care.    Alvira Philips [place], DO [facility] Available via Epic secure chat 7am-7pm After these hours, please refer to coverage provider listed on [email or web address] 11/19/2020, 1:39 PM

## 2020-11-20 DIAGNOSIS — N179 Acute kidney failure, unspecified: Secondary | ICD-10-CM | POA: Diagnosis not present

## 2020-11-20 LAB — BASIC METABOLIC PANEL
Anion gap: 11 (ref 5–15)
BUN: 39 mg/dL — ABNORMAL HIGH (ref 8–23)
CO2: 23 mmol/L (ref 22–32)
Calcium: 9.7 mg/dL (ref 8.9–10.3)
Chloride: 112 mmol/L — ABNORMAL HIGH (ref 98–111)
Creatinine, Ser: 1.06 mg/dL — ABNORMAL HIGH (ref 0.44–1.00)
GFR, Estimated: 53 mL/min — ABNORMAL LOW (ref >60)
Glucose, Bld: 106 mg/dL — ABNORMAL HIGH (ref 70–99)
Potassium: 3.6 mmol/L (ref 3.5–5.1)
Sodium: 146 mmol/L — ABNORMAL HIGH (ref 135–145)

## 2020-11-20 LAB — MAGNESIUM: Magnesium: 2.1 mg/dL (ref 1.7–2.4)

## 2020-11-20 MED ORDER — HYDROCHLOROTHIAZIDE 12.5 MG PO CAPS: 12.5000 mg | ORAL_CAPSULE | Freq: Every day | ORAL | Status: AC

## 2020-11-20 MED ORDER — TRAZODONE HCL 50 MG PO TABS
50.0000 mg | ORAL_TABLET | Freq: Every day | ORAL | 0 refills | Status: AC
Start: 2020-11-20 — End: ?

## 2020-11-20 MED ORDER — HYDROCHLOROTHIAZIDE 12.5 MG PO CAPS
12.5000 mg | ORAL_CAPSULE | Freq: Every day | ORAL | Status: DC
  Administered 2020-11-20 – 2020-11-21 (×2): 12.5 mg via ORAL
  Filled 2020-11-20 (×2): qty 1

## 2020-11-20 MED ORDER — AMLODIPINE BESYLATE 10 MG PO TABS: 10.0000 mg | ORAL_TABLET | Freq: Every day | ORAL | 2 refills | Status: AC

## 2020-11-20 MED ORDER — ALPRAZOLAM 0.25 MG PO TABS
0.2500 mg | ORAL_TABLET | Freq: Every day | ORAL | 0 refills | Status: AC
Start: 2020-11-20 — End: ?

## 2020-11-20 NOTE — [place] FL2 (Signed)
[place] MEDICAID FL2 LEVEL OF CARE SCREENING TOOL     IDENTIFICATION  Patient Name: Angela Griffin Birthdate: 1941-07-16 Sex: female Admission Date (Current Location): 11/14/2020  [facility] and [place] Number:  Producer, television/film/video and Address:  [facility], [address] [place], [place] [address]      Provider Number: [id number]  Attending Physician Name and Address:  [place], Alvira Philips, DO  Relative Name and Phone Number:       Current Level of Care: Hospital Recommended Level of Care: Assisted Living Facility Prior Approval Number:    Date Approved/Denied:   PASRR Number:    Discharge Plan: Other (Comment) (ALF)    Current Diagnoses: Patient Active Problem List   Diagnosis Date Noted  . AKI (acute kidney injury) (HCC) 11/14/2020  . Bilateral lower extremity edema 11/14/2020  . Sinus bradycardia 11/14/2020  . Advanced dementia (HCC) 11/14/2020  . Anemia 11/14/2020  . Low back pain radiating to both legs 02/05/2016  . 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 Right hip pain 10/09/2015  . Toe pain, right 09/02/2015  . Right leg pain 07/10/2015  . DDD (degenerative disc disease), lumbar 04/02/2015  . Pneumococcal vaccination declined 03/15/2015  . LBP (low back pain) 03/07/2015  . Change in blood platelet count 04/17/2014  . Arthritis, degenerative 04/17/2014  . Cannot sleep 04/17/2014  . Hemorrhoid 04/17/2014  . 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 Benign essential HTN 04/17/2014  . 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 Anxiety disorder 04/17/2014  . Abnormal thyroid stimulating hormone (TSH) level 04/17/2014  . Thrombocytopenia (HCC) 04/17/2014    Orientation RESPIRATION BLADDER Height & Weight     Self  Normal Continent Weight:   Height:     BEHAVIORAL SYMPTOMS/MOOD NEUROLOGICAL BOWEL NUTRITION STATUS      Continent Diet (See discharge summary)  AMBULATORY STATUS COMMUNICATION OF NEEDS Skin   Limited Assist Verbally Normal                        Personal Care Assistance Level of Assistance  Bathing,Dressing,Feeding Bathing Assistance: Limited assistance Feeding assistance: Limited assistance Dressing Assistance: Limited assistance     Functional Limitations Info  Sight,Speech,Hearing Sight Info: Adequate Hearing Info: Adequate Speech Info: Adequate    SPECIAL CARE FACTORS FREQUENCY  PT (By licensed PT),OT (By licensed OT)     PT Frequency: 5x a week OT Frequency: 5x a week            Contractures Contractures Info: Not present    Additional Factors Info  Code Status,Allergies Code Status Info: DNR Allergies Info: Prednisone           Current Medications (11/20/2020):  This is the current hospital active medication list Current Facility-Administered Medications  Medication Dose Route Frequency Provider Last Rate Last Admin  . 0.9 %  sodium chloride infusion   Intravenous Continuous Hughie Closs, MD 100 mL/hr at 11/18/20 1338 New Bag at 11/18/20 1338  . acetaminophen (TYLENOL) tablet 650 mg  650 mg Oral Q6H PRN John Giovanni, MD   650 mg at 11/15/20 4627   Or  . acetaminophen (TYLENOL) suppository 650 mg  650 mg Rectal Q6H PRN John Giovanni, MD      . amLODipine (NORVASC) tablet 10 mg  10 mg Oral Daily [place], Alvira Philips, DO   10 mg at 11/20/20 0931  . divalproex (DEPAKOTE SPRINKLE) capsule 125 mg  125 mg Oral TID Westley Chandler, MD   125 mg at 11/20/20  6389  . donepezil (ARICEPT) tablet 10 mg  10 mg Oral Daily Westley Chandler, MD   10 mg at 11/20/20 0931  . escitalopram (LEXAPRO) tablet 10 mg  10 mg Oral Daily Westley Chandler, MD   10 mg at 11/20/20 0931  . heparin injection 5,000 Units  5,000 Units Subcutaneous Q8H John Giovanni, MD   5,000 Units at 11/20/20 0509  . hydrALAZINE (APRESOLINE) injection 10 mg  10 mg Intravenous Q6H PRN Rai, Ripudeep K, MD   10 mg at 11/20/20 0509  . hydrochlorothiazide (MICROZIDE) capsule 12.5 mg  12.5 mg Oral Daily [place], Alvira Philips, DO   12.5 mg at 11/20/20  0931  . loratadine (CLARITIN) tablet 10 mg  10 mg Oral Daily Westley Chandler, MD   10 mg at 11/20/20 0931  . memantine (NAMENDA) tablet 10 mg  10 mg Oral BID Westley Chandler, MD   10 mg at 11/20/20 0931  . risperiDONE (RISPERDAL) tablet 0.5 mg  0.5 mg Oral BID Westley Chandler, MD   0.5 mg at 11/20/20 3734     Discharge Medications: STOP taking these medications   hydrochlorothiazide 25 MG tablet Commonly known as: HYDRODIURIL Replaced by: hydrochlorothiazide 12.5 MG capsule   lisinopril 40 MG tablet Commonly known as: ZESTRIL     TAKE these medications   acetaminophen 500 MG tablet Commonly known as: TYLENOL Take 1,000 mg by mouth See admin instructions. Bid x 30 days   acetaminophen 325 MG tablet Commonly known as: TYLENOL Take 650 mg by mouth every 6 (six) hours as needed for mild pain.   ALPRAZolam 0.25 MG tablet Commonly known as: XANAX Take 1 tablet (0.25 mg total) by mouth at bedtime.   amLODipine 10 MG tablet Commonly known as: NORVASC Take 1 tablet (10 mg total) by mouth daily.   divalproex 125 MG capsule Commonly known as: DEPAKOTE SPRINKLE Take 125 mg by mouth 3 (three) times daily.   donepezil 10 MG tablet Commonly known as: ARICEPT Take 10 mg by mouth daily.   escitalopram 10 MG tablet Commonly known as: LEXAPRO Take 10 mg by mouth daily.   gabapentin 300 MG capsule Commonly known as: NEURONTIN Take 300 mg by mouth 3 (three) times daily.   guaiFENesin 600 MG 12 hr tablet Commonly known as: MUCINEX Take 600 mg by mouth See admin instructions. Q 12 hr.x 7 days   hydrochlorothiazide 12.5 MG capsule Commonly known as: MICROZIDE Take 1 capsule (12.5 mg total) by mouth daily. Replaces: hydrochlorothiazide 25 MG tablet   loratadine 10 MG tablet Commonly known as: CLARITIN Take 10 mg by mouth daily.   memantine 10 MG tablet Commonly known as: NAMENDA Take 10 mg by mouth 2 (two) times daily.   METAMUCIL PO Take 3.4 g by mouth daily  as needed (constipation).   risperiDONE 0.5 MG tablet Commonly known as: RISPERDAL Take 0.5 mg by mouth 2 (two) times daily.   traZODone 50 MG tablet Commonly known as: DESYREL Take 1 tablet (50 mg total) by mouth at bedtime.        Relevant Imaging Results:  Relevant Lab Results:   Additional Information SSN [id number]  Jimmy Picket, [place]

## 2020-11-20 NOTE — Discharge Summary (Signed)
Physician Discharge Summary  Angela Griffin JYN:[id number] DOB: 1940-11-25 DOA: 11/14/2020  PCP: Patient, No Pcp Per  Admit date: 11/14/2020 Discharge date: 11/20/2020  Admitted From: SNF Disposition:  SNF  Recommendations for Outpatient Follow-up:  1. Follow up with physician at facility in 3-5 days following return 2. Started on amlodipine 10 mg p.o. daily, HCTZ 12.5 mg p.o. daily for blood pressure 3. Consider hospice for this patient with advancing dementia, and adult failure to thrive given lack of oral intake; referral placed to palliative care to follow outpatient 4. Please obtain BMP in one week to assess renal function  Discharge Condition: Stable, but overall poor/grim prognosis CODE STATUS: DNR Diet recommendation: Heart healthy, low-salt diet  History of present illness:  Angela Griffin is an 80 year old female with past medical history significant for advanced dementia, OA, diverticulitis, essential hypertension who presented to the ED via EMS for evaluation of bilateral lower extremity edema.  History provided by patient's daughter at bedside reports swelling in her legs around Christmas in which her thought was related to her sedentary lifestyle and eating high sodium foods during the holidays; patient's medications were not changed or added to.  Also patient recently tested positive for Covid-19 on 11/01/2020 and was placed in quarantine for 10 days.  Previous shortness of breath had now resolved.  Daughter reports the facility sent patient to the ED today due to worsening kidney function and swelling in her legs.  Patient was recently seen at [facility] urgent care on 10/29/2020 for bilateral lower extremity edema and significant weight gain.  Normal BNP, creatinine 1.3 with a TSH within normal range.  Chest x-ray with no active cardiopulmonary disease process.  Patient was discharged back to her facility with recommendations of passive elevation of her lower extremities,  compression stockings and modified dietary sodium intake.  In the ED, patient was afebrile, bradycardic with heart rates in the 50s.  BP was 91/52.  WBC 5.5, hemoglobin 10.5, hematocrit 31.8, MCV 98.1, platelet 181, sodium 141, potassium 3.9, chloride 104, bicarb 25, BUN 54, creatinine 2.3 (baseline 0.8-0.9).  LFTs within normal limits.  BMP within normal range.  SARS-CoV-2 PCR test positive.  Chest x-ray with enlargement of cardiac silhouette, aortic atherosclerosis and no acute cardiopulmonary disease process.  Hospital service was consulted for further evaluation and management of lower extremity IMA and acute renal failure.  Hospital course:  Acute renal failure Patient presenting from nursing facility with worsening renal function and peripheral edema.  Etiology likely secondary to poor oral intake in the setting of lisinopril and HCTZ use.  Patient was hydrated with IV fluids on presentation with improvement of her renal function.  Improvement of her creatinine from 2.3 to 1.06 at time of discharge.  Given patient's advanced dementia and failure to thrive with poor oral intake, concerned that she will continue to decompensate.  Referral placed to outpatient palliative care to follow for consideration of hospice.  Bilateral lower extremity edema Etiology likely secondary to dietary indiscretions with high salt.  TTE with preserved LVEF, TSH within normal limits.  Bilateral duplex ultrasound lower extremities negative for DVT.  Continue TED hose/compression stockings and low-salt diet..  Essential hypertension BP 174/97 this morning.  On lisinopril/HCTZ outpatient which was discontinued due to acute renal failure.  Patient was started on Lopressor but developed some asymptomatic bradycardia so this was discontinued.  Started on amlodipine 10 mg p.o. daily, and HCTZ dose reduced to 12.5 mg p.o. twice daily.    Advanced dementia Outpatient palliative  care to follow, consider further goals of  care discussions with family with consideration of hospice care.  Recent Covid-19 infection Diagnosed 11/01/2020; asymptomatic.  No need for isolation as greater than 21 days from initial diagnosis.  Discharge Diagnoses:  Principal Problem:   AKI (acute kidney injury) (HCC) Active Problems:   Bilateral lower extremity edema   Sinus bradycardia   Advanced dementia (HCC)   Anemia    Discharge Instructions  Discharge Instructions    Amb Referral to Palliative Care   Complete by: As directed    Advanced dementia with adult failure to thrive.   Call MD for:  difficulty breathing, headache or visual disturbances   Complete by: As directed    Call MD for:  extreme fatigue   Complete by: As directed    Call MD for:  persistant dizziness or light-headedness   Complete by: As directed    Call MD for:  persistant nausea and vomiting   Complete by: As directed    Call MD for:  severe uncontrolled pain   Complete by: As directed    Call MD for:  temperature >100.4   Complete by: As directed    Diet - low sodium heart healthy   Complete by: As directed    Increase activity slowly   Complete by: As directed      Allergies as of 11/20/2020      Reactions   Prednisone Swelling   Swelling and blisters in her tongue      Medication List    STOP taking these medications   hydrochlorothiazide 25 MG tablet Commonly known as: HYDRODIURIL Replaced by: hydrochlorothiazide 12.5 MG capsule   lisinopril 40 MG tablet Commonly known as: ZESTRIL     TAKE these medications   acetaminophen 500 MG tablet Commonly known as: TYLENOL Take 1,000 mg by mouth See admin instructions. Bid x 30 days   acetaminophen 325 MG tablet Commonly known as: TYLENOL Take 650 mg by mouth every 6 (six) hours as needed for mild pain.   ALPRAZolam 0.25 MG tablet Commonly known as: XANAX Take 1 tablet (0.25 mg total) by mouth at bedtime.   amLODipine 10 MG tablet Commonly known as: NORVASC Take 1 tablet  (10 mg total) by mouth daily.   divalproex 125 MG capsule Commonly known as: DEPAKOTE SPRINKLE Take 125 mg by mouth 3 (three) times daily.   donepezil 10 MG tablet Commonly known as: ARICEPT Take 10 mg by mouth daily.   escitalopram 10 MG tablet Commonly known as: LEXAPRO Take 10 mg by mouth daily.   gabapentin 300 MG capsule Commonly known as: NEURONTIN Take 300 mg by mouth 3 (three) times daily.   guaiFENesin 600 MG 12 hr tablet Commonly known as: MUCINEX Take 600 mg by mouth See admin instructions. Q 12 hr.x 7 days   hydrochlorothiazide 12.5 MG capsule Commonly known as: MICROZIDE Take 1 capsule (12.5 mg total) by mouth daily. Replaces: hydrochlorothiazide 25 MG tablet   loratadine 10 MG tablet Commonly known as: CLARITIN Take 10 mg by mouth daily.   memantine 10 MG tablet Commonly known as: NAMENDA Take 10 mg by mouth 2 (two) times daily.   METAMUCIL PO Take 3.4 g by mouth daily as needed (constipation).   risperiDONE 0.5 MG tablet Commonly known as: RISPERDAL Take 0.5 mg by mouth 2 (two) times daily.   traZODone 50 MG tablet Commonly known as: DESYREL Take 1 tablet (50 mg total) by mouth at bedtime.  Allergies  Allergen Reactions  . Prednisone Swelling    Swelling and blisters in her tongue    Consultations:  None   Procedures/Studies: US RENAL  Result Date: 11/18/2020 CLINICAL DATA:  Acute kidney injury. EXAM: RENAL / URINARY TRACT ULTRASOUND COMPLETE COMPARISON:  None. FINDINGS: Right Kidney: Renal measurements: 9.7 x 8.7 x 4.3 cm = volume: 103 mL. Echogenicity within normal limits. No mass or hydronephrosis visualized. Left Kidney: Renal measurements: 10.3 x 5.7 by 4.7 cm = volume: 142.3 mL. Echogenicity within normal limits. No mass or hydronephrosis visualized. Bladder: Debris noted layering within the dependent portion of the bladder. Other: None. IMPRESSION: 1. Normal appearance of the kidneys.  No hydronephrosis identified. 2. Debris  noted within the dependent portions of the urinary bladder. Electronically Signed   By: Signa Kell M.D.   On: 11/18/2020 09:21   DG Chest Portable 1 View  Result Date: 11/14/2020 CLINICAL DATA:  Edema EXAM: PORTABLE CHEST 1 VIEW COMPARISON:  Portable exam 1613 hours compared to 10/29/2020 FINDINGS: Enlargement of cardiac silhouette. Mediastinal contours and pulmonary vascularity normal. Atherosclerotic calcification aorta. Lungs clear. No pulmonary infiltrate, pleural effusion, or pneumothorax. Osseous structures unremarkable. IMPRESSION: Enlargement of cardiac silhouette without acute abnormalities. Aortic Atherosclerosis (ICD10-I70.0). Electronically Signed   By: Ulyses Southward M.D.   On: 11/14/2020 16:31   ECHOCARDIOGRAM COMPLETE  Result Date: 11/15/2020    ECHOCARDIOGRAM REPORT   Patient Name:   Angela Griffin Date of Exam: 11/15/2020 Medical Rec #:  [id number]  Height:       62.0 in Accession #:    [id number] Weight:       121.0 lb Date of Birth:  05-20-1941 BSA:          1.544 m Patient Age:    79 years   BP:           137/108 mmHg Patient Gender: F          HR:           86 bpm. Exam Location:  Inpatient Procedure: 2D Echo Indications:    acute diastolic chf  History:        Patient has no prior history of Echocardiogram examinations.                 Arrythmias:bradycardia.  Sonographer:    Delcie Roch Referring Phys: [id number] RIPUDEEP K RAI  Sonographer Comments: Image acquisition challenging due to uncooperative patient. IMPRESSIONS  1. Left ventricular ejection fraction, by estimation, is 60 to 65%. The left ventricle has normal function. The left ventricle has no regional wall motion abnormalities. Left ventricular diastolic parameters are consistent with Grade I diastolic dysfunction (impaired relaxation).  2. Right ventricular systolic function is normal. The right ventricular size is normal. There is normal pulmonary artery systolic pressure.  3. The mitral valve is normal in structure. Trivial  mitral valve regurgitation. No evidence of mitral stenosis.  4. The aortic valve is normal in structure. There is mild calcification of the aortic valve. There is mild thickening of the aortic valve. Aortic valve regurgitation is not visualized. No aortic stenosis is present.  5. The inferior vena cava is normal in size with greater than 50% respiratory variability, suggesting right atrial pressure of 3 mmHg. FINDINGS  Left Ventricle: Left ventricular ejection fraction, by estimation, is 60 to 65%. The left ventricle has normal function. The left ventricle has no regional wall motion abnormalities. The left ventricular internal cavity size was normal in size. There is  no left ventricular  hypertrophy. Left ventricular diastolic parameters are consistent with Grade I diastolic dysfunction (impaired relaxation). Indeterminate filling pressures. Right Ventricle: The right ventricular size is normal. No increase in right ventricular wall thickness. Right ventricular systolic function is normal. There is normal pulmonary artery systolic pressure. The tricuspid regurgitant velocity is 2.50 m/s, and  with an assumed right atrial pressure of 3 mmHg, the estimated right ventricular systolic pressure is 28.0 mmHg. Left Atrium: Left atrial size was normal in size. Right Atrium: Right atrial size was normal in size. Pericardium: There is no evidence of pericardial effusion. Mitral Valve: The mitral valve is normal in structure. Trivial mitral valve regurgitation. No evidence of mitral valve stenosis. Tricuspid Valve: The tricuspid valve is normal in structure. Tricuspid valve regurgitation is trivial. No evidence of tricuspid stenosis. Aortic Valve: The aortic valve is normal in structure. There is mild calcification of the aortic valve. There is mild thickening of the aortic valve. Aortic valve regurgitation is not visualized. No aortic stenosis is present. Pulmonic Valve: The pulmonic valve was normal in structure. Pulmonic  valve regurgitation is not visualized. No evidence of pulmonic stenosis. Aorta: The aortic root is normal in size and structure. Venous: The inferior vena cava is normal in size with greater than 50% respiratory variability, suggesting right atrial pressure of 3 mmHg. IAS/Shunts: No atrial level shunt detected by color flow Doppler.  LEFT VENTRICLE PLAX 2D LVIDd:         3.90 cm  Diastology LVIDs:         2.30 cm  LV e' medial:    5.98 cm/s LV PW:         1.16 cm  LV E/e' medial:  9.0 LV IVS:        1.13 cm  LV e' lateral:   8.92 cm/s LVOT diam:     1.80 cm  LV E/e' lateral: 6.1 LV SV:         50 LV SV Index:   32 LVOT Area:     2.54 cm  RIGHT VENTRICLE RV S prime:     20.70 cm/s TAPSE (M-mode): 2.3 cm LEFT ATRIUM             Index       RIGHT ATRIUM           Index LA diam:        3.10 cm 2.01 cm/m  RA Area:     11.50 cm LA Vol (A2C):   41.5 ml 26.88 ml/m RA Volume:   22.40 ml  14.51 ml/m LA Vol (A4C):   35.2 ml 22.80 ml/m LA Biplane Vol: 39.3 ml 25.45 ml/m  AORTIC VALVE LVOT Vmax:   101.00 cm/s LVOT Vmean:  68.900 cm/s LVOT VTI:    0.196 m  AORTA Ao Asc diam: 3.50 cm MITRAL VALVE               TRICUSPID VALVE MV Area (PHT): 2.69 cm    TR Peak grad:   25.0 mmHg MV Decel Time: 282 msec    TR Vmax:        250.00 cm/s MV E velocity: 54.00 cm/s MV A velocity: 80.10 cm/s  SHUNTS MV E/A ratio:  0.67        Systemic VTI:  0.20 m                            Systemic Diam: 1.80 cm Chilton Siiffany [place] MD Electronically signed by  Chilton Si MD Signature Date/Time: 11/15/2020/1:51:45 PM    Final    VAS [place] LOWER EXTREMITY VENOUS (DVT)  Result Date: 11/17/2020  Lower Venous DVT Study Indications: Swelling, d-dimer, Covid.  Comparison Study: No prior studies. Performing Technologist: Jean Rosenthal RDMS  Examination Guidelines: A complete evaluation includes B-mode imaging, spectral Doppler, color Doppler, and power Doppler as needed of all accessible portions of each vessel. Bilateral testing is considered an integral  part of a complete examination. Limited examinations for reoccurring indications may be performed as noted. The reflux portion of the exam is performed with the patient in reverse Trendelenburg.  +---------+---------------+---------+-----------+----------+--------------+ RIGHT    CompressibilityPhasicitySpontaneityPropertiesThrombus Aging +---------+---------------+---------+-----------+----------+--------------+ CFV      Full           Yes      Yes                                 +---------+---------------+---------+-----------+----------+--------------+ SFJ      Full                                                        +---------+---------------+---------+-----------+----------+--------------+ FV Prox  Full                                                        +---------+---------------+---------+-----------+----------+--------------+ FV Mid   Full                                                        +---------+---------------+---------+-----------+----------+--------------+ FV DistalFull                                                        +---------+---------------+---------+-----------+----------+--------------+ PFV      Full                                                        +---------+---------------+---------+-----------+----------+--------------+ POP      Full           Yes      Yes                                 +---------+---------------+---------+-----------+----------+--------------+ PTV      Full                                                        +---------+---------------+---------+-----------+----------+--------------+ PERO  Full                                                        +---------+---------------+---------+-----------+----------+--------------+   +---------+---------------+---------+-----------+----------+--------------+ LEFT     CompressibilityPhasicitySpontaneityPropertiesThrombus Aging  +---------+---------------+---------+-----------+----------+--------------+ CFV      Full           Yes      Yes                                 +---------+---------------+---------+-----------+----------+--------------+ SFJ      Full                                                        +---------+---------------+---------+-----------+----------+--------------+ FV Prox  Full                                                        +---------+---------------+---------+-----------+----------+--------------+ FV Mid   Full                                                        +---------+---------------+---------+-----------+----------+--------------+ FV DistalFull                                                        +---------+---------------+---------+-----------+----------+--------------+ PFV      Full                                                        +---------+---------------+---------+-----------+----------+--------------+ POP      Full           Yes      Yes                                 +---------+---------------+---------+-----------+----------+--------------+ PTV      Full                                                        +---------+---------------+---------+-----------+----------+--------------+ PERO     Full                                                        +---------+---------------+---------+-----------+----------+--------------+  Summary: RIGHT: - There is no evidence of deep vein thrombosis in the lower extremity.  - No cystic structure found in the popliteal fossa.  LEFT: - There is no evidence of deep vein thrombosis in the lower extremity.  - No cystic structure found in the popliteal fossa.  *See table(s) above for measurements and observations. Electronically signed by Waverly Ferrari MD on 11/17/2020 at 11:57:18 AM.    Final       Subjective:   Discharge Exam: Vitals:   11/20/20 0503 11/20/20 0611   BP: (!) 181/88 (!) 156/78  Pulse: 70 (!) 58  Resp: 18   Temp: 97.7 F (36.5 C)   SpO2: 98%    Vitals:   11/19/20 2016 11/19/20 2245 11/20/20 0503 11/20/20 0611  BP: (!) 171/96 (!) 153/71 (!) 181/88 (!) 156/78  Pulse: 77 78 70 (!) 58  Resp: 20  18   Temp: 98.8 F (37.1 C)  97.7 F (36.5 C)   TempSrc: Oral  Oral   SpO2: 95%  98%     General: Pt is alert, awake, not in acute distress, pleasantly confused Cardiovascular: RRR, S1/S2 +, no rubs, no gallops Respiratory: CTA bilaterally, no wheezing, no rhonchi Abdominal: Soft, NT, ND, bowel sounds + Extremities: no edema, no cyanosis    The results of significant diagnostics from this hospitalization (including imaging, microbiology, ancillary and laboratory) are listed below for reference.     Microbiology: Recent Results (from the past 240 hour(s))  SARS CORONAVIRUS 2 (TAT 6-24 HRS) Nasopharyngeal Nasopharyngeal Swab     Status: Abnormal   Collection Time: 11/14/20  7:18 PM   Specimen: Nasopharyngeal Swab  Result Value Ref Range Status   SARS Coronavirus 2 POSITIVE (A) NEGATIVE Final    Comment: (NOTE) SARS-CoV-2 target nucleic acids are DETECTED.  The SARS-CoV-2 RNA is generally detectable in upper and lower respiratory specimens during the acute phase of infection. Positive results are indicative of the presence of SARS-CoV-2 RNA. Clinical correlation with patient history and other diagnostic information is  necessary to determine patient infection status. Positive results do not rule out bacterial infection or co-infection with other viruses.  The expected result is Negative.  Fact Sheet for Patients: HairSlick.no  Fact Sheet for Healthcare Providers: quierodirigir.com  This test is not yet approved or cleared by the [place] FDA and  has been authorized for detection and/or diagnosis of SARS-CoV-2 by FDA under an Emergency Use Authorization (EUA). This  EUA will remain  in effect (meaning this test can be used) for the duration of the COVID-19 declaration under Section 564(b)(1) of the Act, 21 U. S.C. section 360bbb-3(b)(1), unless the authorization is terminated or revoked sooner.   Performed at [facility], [address] [place], [place] [address]   MRSA PCR Screening     Status: None   Collection Time: 11/17/20  4:20 PM   Specimen: Nasal Mucosa; Nasopharyngeal  Result Value Ref Range Status   MRSA by PCR NEGATIVE NEGATIVE Final    Comment:        The GeneXpert MRSA Assay (FDA approved for NASAL specimens only), is one component of a comprehensive MRSA colonization surveillance program. It is not intended to diagnose MRSA infection nor to guide or monitor treatment for MRSA infections. Performed at [facility], [address], [place], [place] [address]      Labs: BNP (last 3 results) Recent Labs    11/14/20 1607  BNP 37.8   Basic Metabolic Panel: Recent  Labs  Lab 11/16/20 0212 11/17/20 0122 11/18/20 0349 11/19/20 0346 11/20/20 0109  NA 138 142 143 144 146*  K 3.7 3.5 3.5 3.5 3.6  CL 102 105 108 109 112*  CO2 20* 26 23 23 23   GLUCOSE 129* 100* 110* 109* 106*  BUN 28* 34* 43* 38* 39*  CREATININE 1.30* 1.42* 1.46* 1.13* 1.06*  CALCIUM 9.7 9.5 9.8 9.4 9.7  MG  --   --   --   --  2.1   Liver Function Tests: Recent Labs  Lab 11/14/20 1549 11/15/20 0728  AST 28 26  ALT 13 16  ALKPHOS 85 94  BILITOT 0.7 0.6  PROT 6.1* 6.7  ALBUMIN 3.2* 3.4*   No results for input(s): LIPASE, AMYLASE in the last 168 hours. No results for input(s): AMMONIA in the last 168 hours. CBC: Recent Labs  Lab 11/14/20 1700 11/15/20 0728 11/16/20 0212 11/17/20 0122 11/18/20 0349  WBC 5.5 10.1 9.2 7.8 9.0  NEUTROABS 3.2  --   --   --   --   HGB 10.5* 12.0 12.1 11.1* 11.5*  HCT 31.8* 35.6* 37.5 34.1* 35.2*  MCV 98.1 93.7 94.2 94.7 95.4  PLT 181 209 227 200 190   Cardiac Enzymes: No results for  input(s): CKTOTAL, CKMB, CKMBINDEX, TROPONINI in the last 168 hours. BNP: Invalid input(s): POCBNP CBG: No results for input(s): GLUCAP in the last 168 hours. D-Dimer Recent Labs    11/18/20 0349  DDIMER 2.47*   Hgb A1c No results for input(s): HGBA1C in the last 72 hours. Lipid Profile No results for input(s): CHOL, HDL, LDLCALC, TRIG, CHOLHDL, LDLDIRECT in the last 72 hours. Thyroid function studies No results for input(s): TSH, T4TOTAL, T3FREE, THYROIDAB in the last 72 hours.  Invalid input(s): FREET3 Anemia work up No results for input(s): VITAMINB12, FOLATE, FERRITIN, TIBC, IRON, RETICCTPCT in the last 72 hours. Urinalysis    Component Value Date/Time   COLORURINE STRAW (A) 06/21/2020 2006   APPEARANCEUR CLEAR 06/21/2020 2006   LABSPEC 1.015 06/21/2020 2006   PHURINE 6.0 06/21/2020 2006   GLUCOSEU NEGATIVE 06/21/2020 2006   HGBUR SMALL (A) 06/21/2020 2006   BILIRUBINUR NEGATIVE 06/21/2020 2006   KETONESUR NEGATIVE 06/21/2020 2006   PROTEINUR 100 (A) 06/21/2020 2006   UROBILINOGEN 0.2 05/05/2014 1344   NITRITE NEGATIVE 06/21/2020 2006   LEUKOCYTESUR NEGATIVE 06/21/2020 2006   Sepsis Labs Invalid input(s): PROCALCITONIN,  WBC,  LACTICIDVEN Microbiology Recent Results (from the past 240 hour(s))  SARS CORONAVIRUS 2 (TAT 6-24 HRS) Nasopharyngeal Nasopharyngeal Swab     Status: Abnormal   Collection Time: 11/14/20  7:18 PM   Specimen: Nasopharyngeal Swab  Result Value Ref Range Status   SARS Coronavirus 2 POSITIVE (A) NEGATIVE Final    Comment: (NOTE) SARS-CoV-2 target nucleic acids are DETECTED.  The SARS-CoV-2 RNA is generally detectable in upper and lower respiratory specimens during the acute phase of infection. Positive results are indicative of the presence of SARS-CoV-2 RNA. Clinical correlation with patient history and other diagnostic information is  necessary to determine patient infection status. Positive results do not rule out bacterial infection or  co-infection with other viruses.  The expected result is Negative.  Fact Sheet for Patients: 11/16/20  Fact Sheet for Healthcare Providers: HairSlick.no  This test is not yet approved or cleared by the [place] FDA and  has been authorized for detection and/or diagnosis of SARS-CoV-2 by FDA under an Emergency Use Authorization (EUA). This EUA will remain  in effect (meaning this test  can be used) for the duration of the COVID-19 declaration under Section 564(b)(1) of the Act, 21 U. S.C. section 360bbb-3(b)(1), unless the authorization is terminated or revoked sooner.   Performed at [facility], [address] [place], [place] [address]   MRSA PCR Screening     Status: None   Collection Time: 11/17/20  4:20 PM   Specimen: Nasal Mucosa; Nasopharyngeal  Result Value Ref Range Status   MRSA by PCR NEGATIVE NEGATIVE Final    Comment:        The GeneXpert MRSA Assay (FDA approved for NASAL specimens only), is one component of a comprehensive MRSA colonization surveillance program. It is not intended to diagnose MRSA infection nor to guide or monitor treatment for MRSA infections. Performed at [facility], [address] [place], [place] [address]      Time coordinating discharge: Over 30 minutes  SIGNED:   Alvira Philips [place], DO  [facility] 11/20/2020, 9:21 AM

## 2020-11-20 NOTE — Progress Notes (Signed)
Spoke with the patient's son to confirm that was he still intending to transport patient back to her ALF. Jeannett Senior states that he was told that the facility had not received the FL2 or DC instructions from here. Spoke with Keshia at the receiving facility and she states that she is not on site today but did have staff check to make sure the fax had come in and it was reported to her that it had not. Patient's son is also now requesting that patient return to the facility via PTAR since she was transported here by EMS. This RN re-faxed FL2 and AVS to [facility] and will try to arrange PTAR for early AM pickup.

## 2020-11-20 NOTE — TOC Transition Note (Signed)
Transition of Care [facility]) - CM/SW Discharge Note   Patient Details  Name: Angela Griffin MRN: [id number] Date of Birth: May 25, 1941  [facility]) CM/SW Contact:  Jimmy Picket, [place] Phone Number: 11/20/2020, 4:12 PM   Clinical Narrative:     CSW will DC back to her memory care ALF. Pts so or daughter will transport back to facility. Signed FL2 and Discharge summary has been faxed to [facility] at [phone].   TOC will now sign off  Final next level of care: Assisted Living Barriers to Discharge: Barriers Resolved   Patient Goals and CMS Choice Patient states their goals for this hospitalization and ongoing recovery are:: per son, to return to ALF CMS Medicare.gov Compare Post Acute Care list provided to:: Patient Choice offered to / list presented to : Adult Children  Discharge Placement              Patient chooses bed at: Other - please specify in the comment section below: [facility]) Patient to be transferred to facility by: Family car Name of family member notified: Son Patient and family notified of of transfer: 11/20/20  Discharge Plan and Services                                     Social Determinants of Health (SDOH) Interventions     Readmission Risk Interventions No flowsheet data found.  Jimmy Picket, Theresia Majors, [place] Clinical Social Worker [phone]

## 2020-11-20 NOTE — Care Management Important Message (Signed)
Important Message  Patient Details  Name: Angela Griffin MRN: [id number] Date of Birth: 31-Oct-1940   Medicare Important Message Given:  Yes - Important Message mailed due to current National Emergency   Verbal consent obtained due to current National Emergency  Relationship to patient: Self   Call Date: 11/20/20  Time: 1530 Phone: [phone] Outcome: No Answer/Busy Important Message mailed to: Patient address on file     Orson Aloe 11/20/2020, 3:30 PM

## 2020-11-21 NOTE — Plan of Care (Signed)
Patient discharged to [facility]. Report called to [facility]. Transportation by [employer]. Problem: Clinical Measurements: Goal: Ability to maintain clinical measurements within normal limits will improve Outcome: Adequate for Discharge Goal: Will remain free from infection Outcome: Adequate for Discharge Goal: Diagnostic test results will improve Outcome: Adequate for Discharge Goal: Respiratory complications will improve Outcome: Adequate for Discharge Goal: Cardiovascular complication will be avoided Outcome: Adequate for Discharge   Problem: Coping: Goal: Level of anxiety will decrease Outcome: Adequate for Discharge   Problem: Elimination: Goal: Will not experience complications related to urinary retention Outcome: Adequate for Discharge   Problem: Safety: Goal: Ability to remain free from injury will improve Outcome: Adequate for Discharge

## 2020-12-24 DEATH — deceased

## 2021-12-09 IMAGING — US US RENAL
1 series · 14 of 25 positions shown · non-contrast
Comparison: None.

CLINICAL DATA: Acute kidney injury.

EXAM:
RENAL / URINARY TRACT ULTRASOUND COMPLETE

[Series 1: us renal · 14 of 32 slices shown]
[im 1/32]
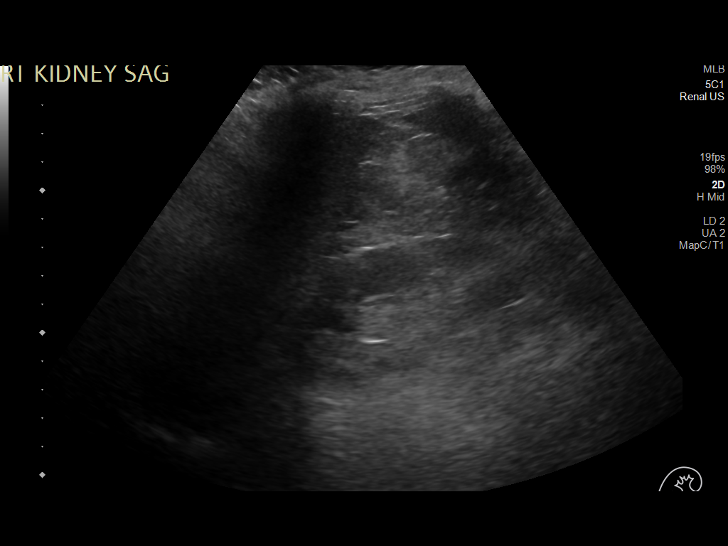
[im 3/32]
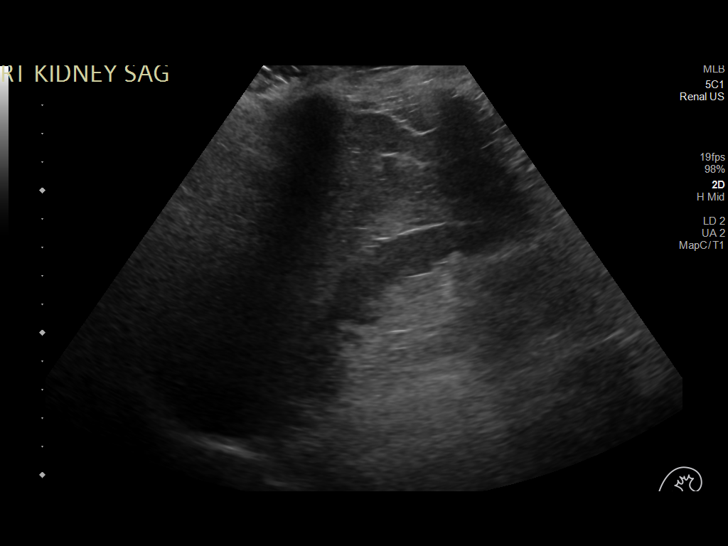
[im 6/32]
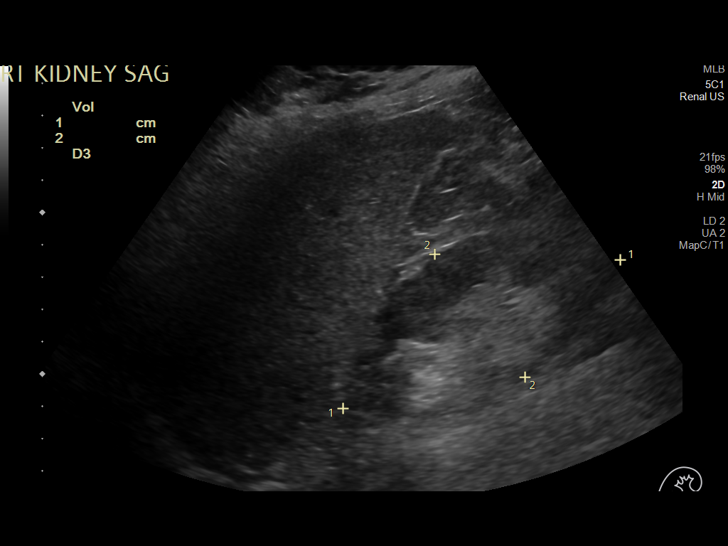
[im 8/32]
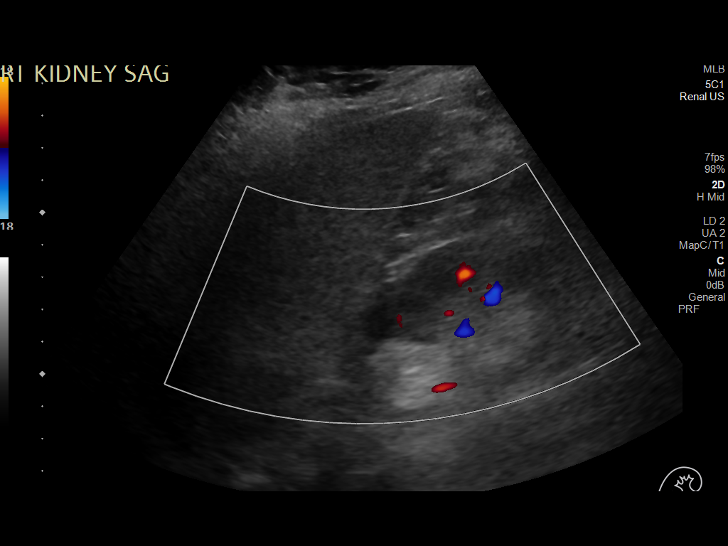
[im 11/32]
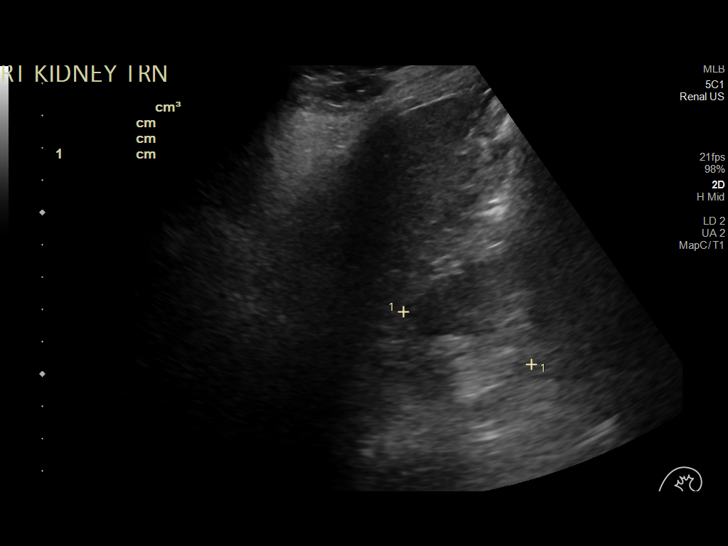
[im 12/32]
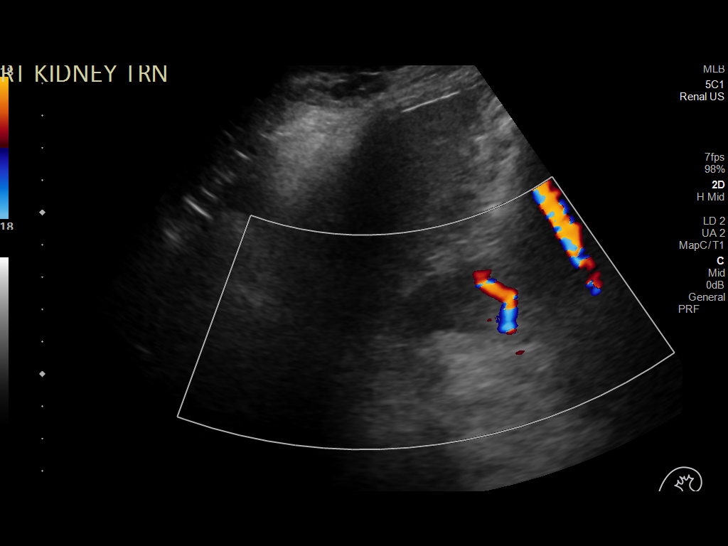
[im 15/32]
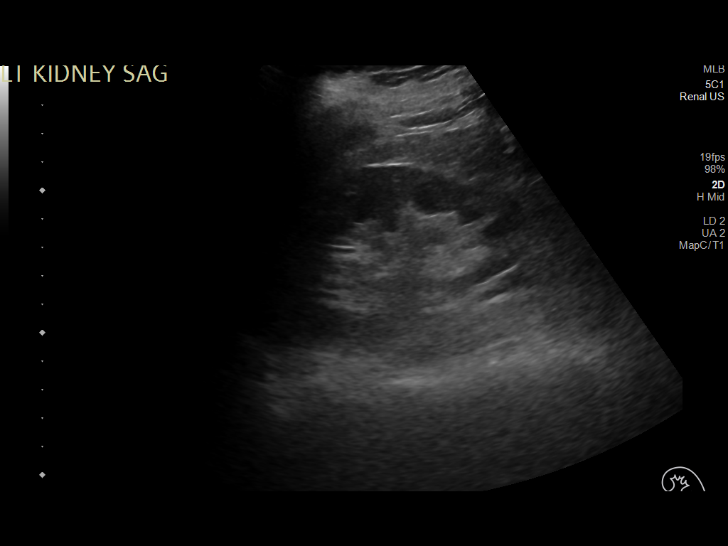
[im 17/32]
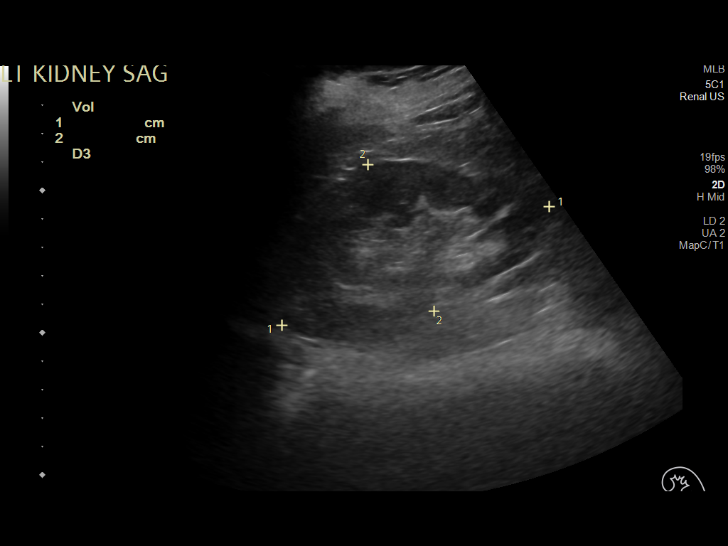
[im 20/32]
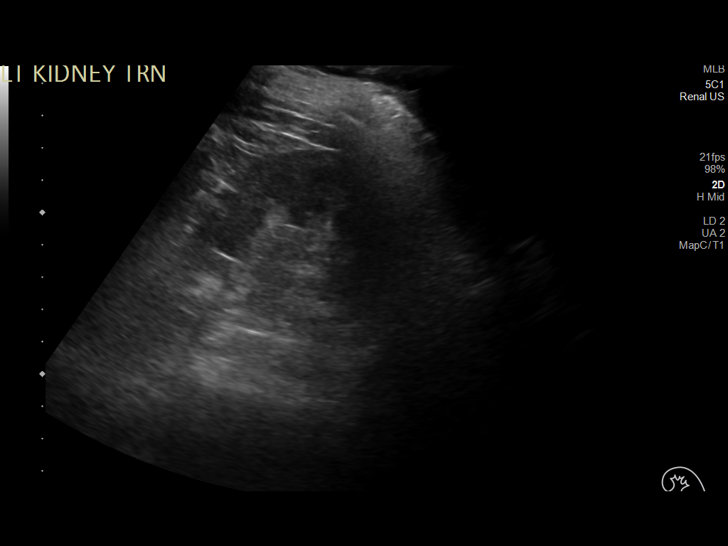
[im 21/32]
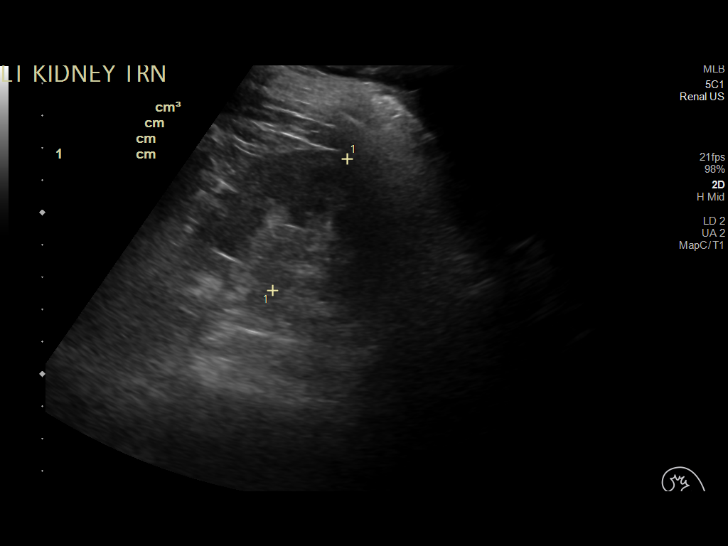
[im 24/32]
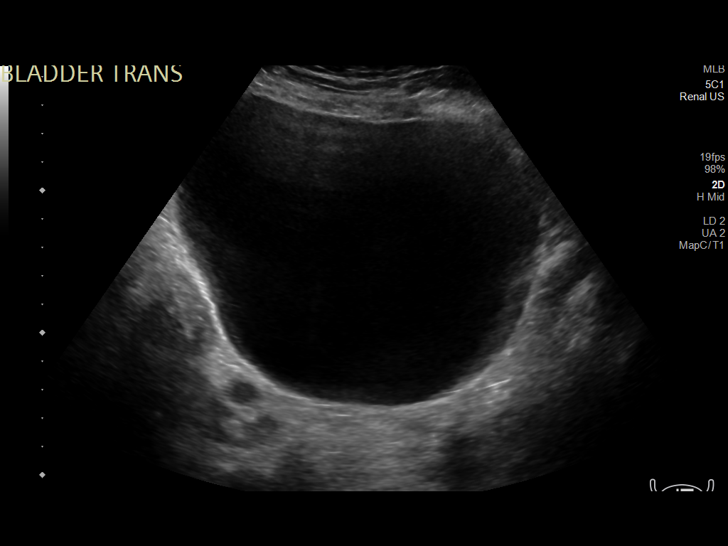
[im 26/32]
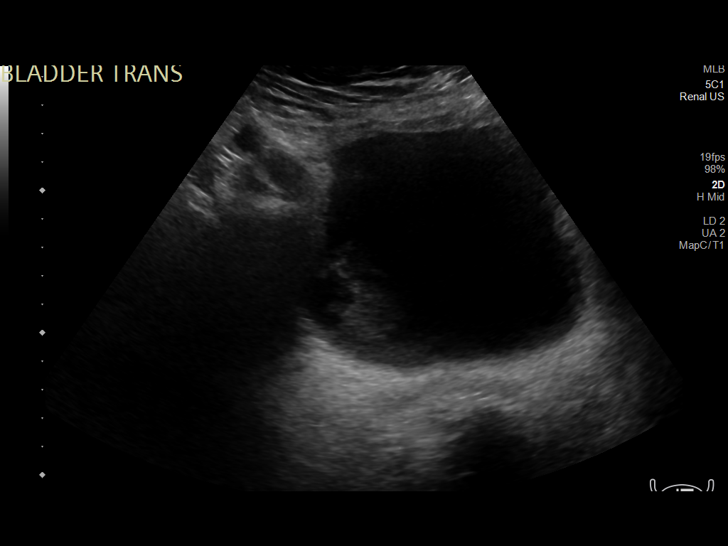
[im 29/32]
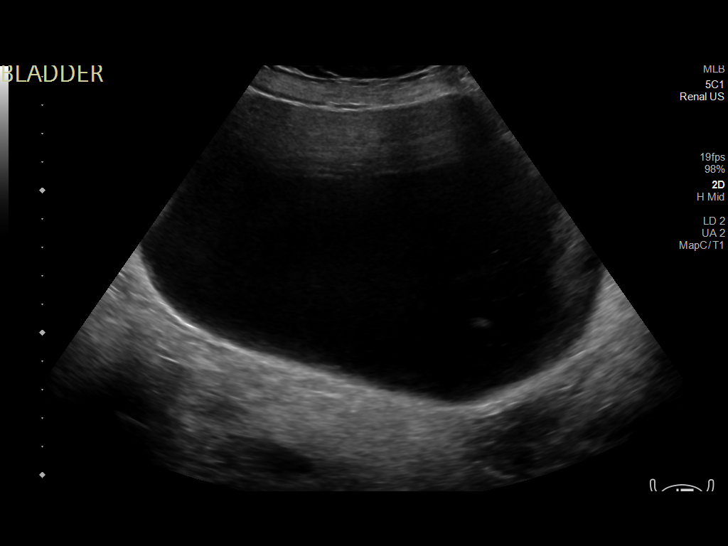
[im 32/32]
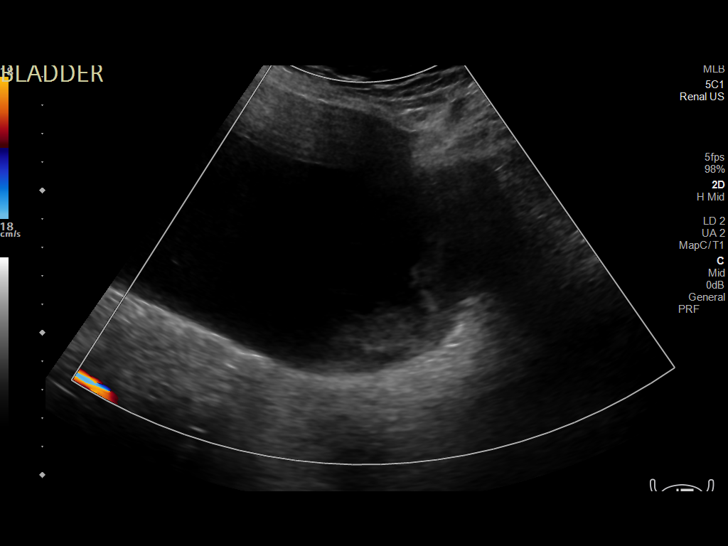

[14 of 25 positions shown; findings below may reference images not displayed]

FINDINGS: Right Kidney:

Renal measurements: 9.7 x 8.7 x 4.3 cm = volume: 103 mL.
Echogenicity within normal limits. No mass or hydronephrosis
visualized.

Left Kidney:

Renal measurements: 10.3 x 5.7 by 4.7 cm = volume: 142.3 mL.
Echogenicity within normal limits. No mass or hydronephrosis
visualized.

Bladder:

Debris noted layering within the dependent portion of the bladder.

Other:

None.
IMPRESSION: 1. Normal appearance of the kidneys.  No hydronephrosis identified.
2. Debris noted within the dependent portions of the urinary
bladder.
